# Patient Record
Sex: Female | Born: 1965 | Race: White | Hispanic: No | Marital: Single | Smoking: Never smoker
Health system: Southern US, Community
[De-identification: ages and names within clinical notes are randomized; demographics above are authoritative.]

## PROBLEM LIST (undated history)

## (undated) HISTORY — PX: APPENDECTOMY: SHX54

## (undated) HISTORY — PX: BREAST ENHANCEMENT SURGERY: SHX7

---

## 1992-01-12 HISTORY — PX: AUGMENTATION MAMMAPLASTY: SUR837

## 2015-01-12 LAB — HM MAMMOGRAPHY

## 2015-01-30 ENCOUNTER — Other Ambulatory Visit: Payer: Self-pay

## 2015-02-03 ENCOUNTER — Encounter: Payer: Self-pay | Admitting: Family Medicine

## 2015-02-10 ENCOUNTER — Ambulatory Visit (INDEPENDENT_AMBULATORY_CARE_PROVIDER_SITE_OTHER): Payer: 59 | Admitting: Family Medicine

## 2015-02-10 ENCOUNTER — Encounter: Payer: Self-pay | Admitting: Family Medicine

## 2015-02-10 VITALS — BP 120/64 | HR 64 | Ht 69.0 in | Wt 176.0 lb

## 2015-02-10 DIAGNOSIS — Z79890 Hormone replacement therapy (postmenopausal): Secondary | ICD-10-CM

## 2015-02-10 DIAGNOSIS — Z1211 Encounter for screening for malignant neoplasm of colon: Secondary | ICD-10-CM | POA: Diagnosis not present

## 2015-02-10 LAB — HEMOCCULT GUIAC POC 1CARD (OFFICE): Fecal Occult Blood, POC: NEGATIVE

## 2015-02-10 MED ORDER — ESTROGENS CONJUGATED 1.25 MG PO TABS: 1.2500 mg | ORAL_TABLET | Freq: Every day | ORAL | Status: DC

## 2015-02-10 NOTE — Patient Instructions (Signed)
Hormone Therapy At menopause, your body begins making less estrogen and progesterone hormones. This causes the body to stop having menstrual periods. This is because estrogen and progesterone hormones control your periods and menstrual cycle. A lack of estrogen may cause symptoms such as:  Hot flushes (or hot flashes).  Vaginal dryness.  Dry skin.  Loss of sex drive.  Risk of bone loss (osteoporosis). When this happens, you may choose to take hormone therapy to get back the estrogen lost during menopause. When the hormone estrogen is given alone, it is usually referred to as ET (Estrogen Therapy). When the hormone progestin is combined with estrogen, it is generally called HT (Hormone Therapy). This was formerly known as hormone replacement therapy (HRT). Your caregiver can help you make a decision on what will be best for you. The decision to use HT seems to change often as new studies are done. Many studies do not agree on the benefits of hormone replacement therapy. LIKELY BENEFITS OF HT INCLUDE PROTECTION FROM:  Hot Flushes (also called hot flashes) - A hot flush is a sudden feeling of heat that spreads over the face and body. The skin may redden like a blush. It is connected with sweats and sleep disturbance. Women going through menopause may have hot flushes a few times a month or several times per day depending on the woman.  Osteoporosis (bone loss) - Estrogen helps guard against bone loss. After menopause, a woman's bones slowly lose calcium and become weak and brittle. As a result, bones are more likely to break. The hip, wrist, and spine are affected most often. Hormone therapy can help slow bone loss after menopause. Weight bearing exercise and taking calcium with vitamin D also can help prevent bone loss. There are also medications that your caregiver can prescribe that can help prevent osteoporosis.  Vaginal dryness - Loss of estrogen causes changes in the vagina. Its lining may  become thin and dry. These changes can cause pain and bleeding during sexual intercourse. Dryness can also lead to infections. This can cause burning and itching. (Vaginal estrogen treatment can help relieve pain, itching, and dryness.)  Urinary tract infections are more common after menopause because of lack of estrogen. Some women also develop urinary incontinence because of low estrogen levels in the vagina and bladder.  Possible other benefits of estrogen include a positive effect on mood and short-term memory in women. RISKS AND COMPLICATIONS  Using estrogen alone without progesterone causes the lining of the uterus to grow. This increases the risk of lining of the uterus (endometrial) cancer. Your caregiver should give another hormone called progestin if you have a uterus.  Women who take combined (estrogen and progestin) HT appear to have an increased risk of breast cancer. The risk appears to be small, but increases throughout the time that HT is taken.  Combined therapy also makes the breast tissue slightly denser which makes it harder to read mammograms (breast X-rays).  Combined, estrogen and progesterone therapy can be taken together every day, in which case there may be spotting of blood. HT therapy can be taken cyclically in which case you will have menstrual periods. Cyclically means HT is taken for a set amount of days, then not taken, then this process is repeated.  HT may increase the risk of stroke, heart attack, breast cancer and forming blood clots in your leg.  Transdermal estrogen (estrogen that is absorbed through the skin with a patch or a cream) may have better results with:  Cholesterol.  Blood pressure.  Blood clots. Having the following conditions may indicate you should not have HT:  Endometrial cancer.  Liver disease.  Breast cancer.  Heart disease.  History of blood clots.  Stroke. TREATMENT   If you choose to take HT and have a uterus, usually  estrogen and progestin are prescribed.  Your caregiver will help you decide the best way to take the medications.  Possible ways to take estrogen include:  Pills.  Patches.  Gels.  Sprays.  Vaginal estrogen cream, rings and tablets.  It is best to take the lowest dose possible that will help your symptoms and take them for the shortest period of time that you can.  Hormone therapy can help relieve some of the problems (symptoms) that affect women at menopause. Before making a decision about HT, talk to your caregiver about what is best for you. Be well informed and comfortable with your decisions. HOME CARE INSTRUCTIONS   Follow your caregivers advice when taking the medications.  A Pap test is done to screen for cervical cancer.  The first Pap test should be done at age 34.  Between ages 80 and 52, Pap tests are repeated every 2 years.  Beginning at age 13, you are advised to have a Pap test every 3 years as long as the past 3 Pap tests have been normal.  Some women have medical problems that increase the chance of getting cervical cancer. Talk to your caregiver about these problems. It is especially important to talk to your caregiver if a new problem develops soon after your last Pap test. In these cases, your caregiver may recommend more frequent screening and Pap tests.  The above recommendations are the same for women who have or have not gotten the vaccine for HPV (human papillomavirus).  If you had a hysterectomy for a problem that was not a cancer or a condition that could lead to cancer, then you no longer need Pap tests. However, even if you no longer need a Pap test, a regular exam is a good idea to make sure no other problems are starting.  If you are between ages 20 and 60, and you have had normal Pap tests going back 10 years, you no longer need Pap tests. However, even if you no longer need a Pap test, a regular exam is a good idea to make sure no other problems  are starting.  If you have had past treatment for cervical cancer or a condition that could lead to cancer, you need Pap tests and screening for cancer for at least 20 years after your treatment.  If Pap tests have been discontinued, risk factors (such as a new sexual partner)need to be re-assessed to determine if screening should be resumed.  Some women may need screenings more often if they are at high risk for cervical cancer.  Get mammograms done as per the advice of your caregiver. SEEK IMMEDIATE MEDICAL CARE IF:  You develop abnormal vaginal bleeding.  You have pain or swelling in your legs, shortness of breath, or chest pain.  You develop dizziness or headaches.  You have lumps or changes in your breasts or armpits.  You have slurred speech.  You develop weakness or numbness of your arms or legs.  You have pain, burning, or bleeding when urinating.  You develop abdominal pain.   This information is not intended to replace advice given to you by your health care provider. Make sure you discuss any questions  you have with your health care provider.   Document Released: 09/26/2002 Document Revised: 05/14/2014 Document Reviewed: 07/01/2014 Elsevier Interactive Patient Education 2016 [employer]  

## 2015-02-10 NOTE — Progress Notes (Signed)
Name: Christy Wright   MRN: [id number]    DOB: 10-Mar-1965   Date:02/10/2015       Progress Note  Subjective  Chief Complaint  Chief Complaint  Patient presents with  . Annual Exam  . hormone replacement therapy    HPI Comments: Patient presents for hormone replacement therapy.   No problem-specific assessment & plan notes found for this encounter.   No past medical history on file.  Past Surgical History  Procedure Laterality Date  . Breast enhancement surgery      with GRS  . Appendectomy      No family history on file.  Social History   Social History  . Marital Status: Married    Spouse Name: N/A  . Number of Children: N/A  . Years of Education: N/A   Occupational History  . Not on file.   Social History Main Topics  . Smoking status: Never Smoker   . Smokeless tobacco: Not on file  . Alcohol Use: 0.0 oz/week    0 Standard drinks or equivalent per week  . Drug Use: No  . Sexual Activity: Not on file   Other Topics Concern  . Not on file   Social History Narrative  . No narrative on file    No Known Allergies   Review of Systems  Constitutional: Negative for fever, chills, weight loss and malaise/fatigue.  HENT: Negative for ear discharge, ear pain and sore throat.   Eyes: Negative for blurred vision.  Respiratory: Negative for cough, sputum production, shortness of breath and wheezing.   Cardiovascular: Negative for chest pain, palpitations and leg swelling.  Gastrointestinal: Negative for heartburn, nausea, abdominal pain, diarrhea, constipation, blood in stool and melena.  Genitourinary: Negative for dysuria, urgency, frequency and hematuria.  Musculoskeletal: Negative for myalgias, back pain, joint pain and neck pain.  Skin: Negative for rash.  Neurological: Negative for dizziness, tingling, sensory change, focal weakness and headaches.  Endo/Heme/Allergies: Negative for environmental allergies and polydipsia. Does not bruise/bleed easily.   Psychiatric/Behavioral: Negative for depression and suicidal ideas. The patient is not nervous/anxious and does not have insomnia.      Objective  Filed Vitals:   02/10/15 0946  BP: 120/64  Pulse: 64  Height: 5\' 9"  (1.753 m)  Weight: 176 lb (79.833 kg)    Physical Exam  Constitutional: She is well-developed, well-nourished, and in no distress. No distress.  HENT:  Head: Normocephalic and atraumatic.  Right Ear: External ear normal.  Left Ear: External ear normal.  Nose: Nose normal.  Mouth/Throat: Oropharynx is clear and moist.  Eyes: Conjunctivae and EOM are normal. Pupils are equal, round, and reactive to light. Right eye exhibits no discharge. Left eye exhibits no discharge.  Neck: Normal range of motion. Neck supple. No JVD present. No thyromegaly present.  Cardiovascular: Normal rate, regular rhythm, normal heart sounds and intact distal pulses.  Exam reveals no gallop and no friction rub.   No murmur heard. Pulmonary/Chest: Effort normal and breath sounds normal.  Abdominal: Soft. Bowel sounds are normal. She exhibits no mass. There is no tenderness. There is no guarding.  Genitourinary: Rectum normal.  Prostate normal  Musculoskeletal: Normal range of motion. She exhibits no edema.  Lymphadenopathy:    She has no cervical adenopathy.  Neurological: She is alert. She has normal reflexes.  Skin: Skin is warm and dry. She is not diaphoretic.  Psychiatric: Mood and affect normal.  Nursing note and vitals reviewed.     Assessment & Plan  Problem  List Items Addressed This Visit    None    Visit Diagnoses    Hormone replacement therapy (HRT)    -  Primary    Relevant Medications    estrogens, conjugated, (PREMARIN) 1.25 MG tablet    Colon cancer screening        Relevant Orders    POCT occult blood stool (Completed)    Ambulatory referral to Gastroenterology         Dr. Otilio Miu [facility] [place] [facility]  02/10/2015

## 2015-02-23 ENCOUNTER — Telehealth: Payer: Self-pay | Admitting: Gastroenterology

## 2015-02-23 NOTE — Telephone Encounter (Signed)
Gastroenterology Pre-Procedure Review   Request Date: 03-10-2015 Requesting Physician: Dr.   PATIENT REVIEW QUESTIONS: The patient responded to the following health history questions as indicated:    1. Are you having any GI issues? no 2. Do you have a personal history of Polyps? no 3. Do you have a family history of Colon Cancer or Polyps? no 4. Diabetes Mellitus? no 5. Joint replacements in the past 12 months?no 6. Major health problems in the past 3 months?no 7. Any artificial heart valves, MVP, or defibrillator?no    MEDICATIONS & ALLERGIES:    Patient reports the following regarding taking any anticoagulation/antiplatelet therapy:   Plavix, Coumadin, Eliquis, Xarelto, Lovenox, Pradaxa, Brilinta, or Effient? no Aspirin? Yes  Patient confirms/reports the following medications:  Current Outpatient Prescriptions  Medication Sig Dispense Refill   aspirin 325 MG tablet Take 325 mg by mouth daily.     estradiol (VIVELLE-DOT) 0.1 MG/24HR patch Place 2 patches onto the skin.  6   estrogens, conjugated, (PREMARIN) 1.25 MG tablet Take 1 tablet (1.25 mg total) by mouth daily. 30 tablet 11   No current facility-administered medications for this visit.    Patient confirms/reports the following allergies:  No Known Allergies  No orders of the defined types were placed in this encounter.    AUTHORIZATION INFORMATION Primary Insurance: 1D#: Group #:  Secondary Insurance: 1D#: Group #:  SCHEDULE INFORMATION: Date: 03-10-2015 Time: Location:[facility]

## 2015-02-24 ENCOUNTER — Other Ambulatory Visit: Payer: Self-pay

## 2015-03-03 ENCOUNTER — Encounter: Payer: Self-pay | Admitting: *Deleted

## 2015-03-06 ENCOUNTER — Other Ambulatory Visit: Payer: Self-pay

## 2015-03-06 ENCOUNTER — Telehealth: Payer: Self-pay | Admitting: Gastroenterology

## 2015-03-06 DIAGNOSIS — Z1211 Encounter for screening for malignant neoplasm of colon: Secondary | ICD-10-CM

## 2015-03-06 MED ORDER — NA SULFATE-K SULFATE-MG SULF 17.5-3.13-1.6 GM/177ML PO SOLN: 1.0000 | ORAL | Status: DC

## 2015-03-06 NOTE — Telephone Encounter (Signed)
Pt notified rx has been sent to [facility] per her request.

## 2015-03-06 NOTE — Telephone Encounter (Signed)
Patient has called and stated that she has lost her prescription for her colonoscopy that is scheduled for 03/10/15 with Dr Allen Norris. Patient would like to know if this could be called in at [facility] [place]--[address] [place] [address] Telephone [phone] [facility]. Please call patient once this is done.

## 2015-03-07 NOTE — Discharge Instructions (Signed)

## 2015-03-10 ENCOUNTER — Ambulatory Visit: Payer: 59 | Admitting: Anesthesiology

## 2015-03-10 ENCOUNTER — Ambulatory Visit
Admission: RE | Admit: 2015-03-10 | Discharge: 2015-03-10 | Disposition: A | Discharge status: Home / Self Care | Payer: 59 | Source: Ambulatory Visit | Attending: Gastroenterology | Admitting: Gastroenterology

## 2015-03-10 ENCOUNTER — Encounter
Admission: RE | Disposition: A | Discharge status: Home / Self Care | Payer: Self-pay | Source: Ambulatory Visit | Attending: Gastroenterology

## 2015-03-10 DIAGNOSIS — Z7982 Long term (current) use of aspirin: Secondary | ICD-10-CM | POA: Diagnosis not present

## 2015-03-10 DIAGNOSIS — K640 First degree hemorrhoids: Secondary | ICD-10-CM | POA: Diagnosis not present

## 2015-03-10 DIAGNOSIS — D123 Benign neoplasm of transverse colon: Secondary | ICD-10-CM | POA: Diagnosis not present

## 2015-03-10 DIAGNOSIS — Z1211 Encounter for screening for malignant neoplasm of colon: Secondary | ICD-10-CM | POA: Insufficient documentation

## 2015-03-10 DIAGNOSIS — Z79899 Other long term (current) drug therapy: Secondary | ICD-10-CM | POA: Insufficient documentation

## 2015-03-10 HISTORY — PX: COLONOSCOPY WITH PROPOFOL: SHX5780

## 2015-03-10 HISTORY — PX: POLYPECTOMY: SHX5525

## 2015-03-10 SURGERY — COLONOSCOPY WITH PROPOFOL
Anesthesia: Monitor Anesthesia Care

## 2015-03-10 MED ORDER — ACETAMINOPHEN 325 MG PO TABS: 325.0000 mg | ORAL_TABLET | ORAL | Status: DC | PRN

## 2015-03-10 MED ORDER — LIDOCAINE HCL (CARDIAC) 20 MG/ML IV SOLN
INTRAVENOUS | Status: DC | PRN
  Administered 2015-03-10: 20 mg via INTRAVENOUS

## 2015-03-10 MED ORDER — STERILE WATER FOR IRRIGATION IR SOLN
Status: DC | PRN
  Administered 2015-03-10: 10:00:00

## 2015-03-10 MED ORDER — PROPOFOL 10 MG/ML IV BOLUS
INTRAVENOUS | Status: DC | PRN
  Administered 2015-03-10: 10 mg via INTRAVENOUS
  Administered 2015-03-10 (×2): 20 mg via INTRAVENOUS
  Administered 2015-03-10: 30 mg via INTRAVENOUS
  Administered 2015-03-10: 20 mg via INTRAVENOUS
  Administered 2015-03-10: 100 mg via INTRAVENOUS
  Administered 2015-03-10 (×2): 50 mg via INTRAVENOUS

## 2015-03-10 MED ORDER — ACETAMINOPHEN 160 MG/5ML PO SOLN: 325.0000 mg | ORAL | Status: DC | PRN

## 2015-03-10 MED ORDER — LACTATED RINGERS IV SOLN
INTRAVENOUS | Status: DC
  Administered 2015-03-10 (×2): via INTRAVENOUS

## 2015-03-10 MED ORDER — SODIUM CHLORIDE 0.9 % IV SOLN: INTRAVENOUS | Status: DC

## 2015-03-10 SURGICAL SUPPLY — 28 items
CANISTER SUCT 1200ML W/VALVE (MISCELLANEOUS) ×3 IMPLANT
FCP ESCP3.2XJMB 240X2.8X (MISCELLANEOUS) ×2
FORCEPS BIOP RAD 4 LRG CAP 4 (CUTTING FORCEPS) IMPLANT
FORCEPS BIOP RJ4 240 W/NDL (MISCELLANEOUS) ×1
FORCEPS ESCP3.2XJMB 240X2.8X (MISCELLANEOUS) ×2 IMPLANT
GOWN CVR UNV OPN BCK APRN NK (MISCELLANEOUS) ×4 IMPLANT
GOWN ISOL THUMB LOOP REG UNIV (MISCELLANEOUS) ×2
HEMOCLIP INSTINCT (CLIP) IMPLANT
INJECTOR VARIJECT VIN23 (MISCELLANEOUS) IMPLANT
KIT CO2 TUBING (TUBING) IMPLANT
KIT DEFENDO VALVE AND CONN (KITS) IMPLANT
KIT ENDO PROCEDURE OLY (KITS) ×3 IMPLANT
LIGATOR MULTIBAND 6SHOOTER MBL (MISCELLANEOUS) IMPLANT
MARKER SPOT ENDO TATTOO 5ML (MISCELLANEOUS) IMPLANT
PAD GROUND ADULT SPLIT (MISCELLANEOUS) IMPLANT
SNARE SHORT THROW 13M SML OVAL (MISCELLANEOUS) IMPLANT
SNARE SHORT THROW 30M LRG OVAL (MISCELLANEOUS) IMPLANT
SPOT EX ENDOSCOPIC TATTOO (MISCELLANEOUS)
SUCTION POLY TRAP 4CHAMBER (MISCELLANEOUS) IMPLANT
TRAP SUCTION POLY (MISCELLANEOUS) IMPLANT
TUBING CONN 6MMX3.1M (TUBING)
TUBING SUCTION CONN 0.25 STRL (TUBING) IMPLANT
UNDERPAD 30X60 958B10 (PK) (MISCELLANEOUS) IMPLANT
VALVE BIOPSY ENDO (VALVE) IMPLANT
VARIJECT INJECTOR VIN23 (MISCELLANEOUS)
WATER AUXILLARY (MISCELLANEOUS) IMPLANT
WATER STERILE IRR 250ML POUR (IV SOLUTION) ×3 IMPLANT
WATER STERILE IRR 500ML POUR (IV SOLUTION) IMPLANT

## 2015-03-10 NOTE — Anesthesia Preprocedure Evaluation (Signed)
Anesthesia Evaluation  Patient identified by MRN, date of birth, ID band  Reviewed: Allergy & Precautions, H&P , NPO status , Patient's Chart, lab work & pertinent test results  Airway Mallampati: II  TM Distance: >3 FB Neck ROM: full    Dental no notable dental hx.    Pulmonary    Pulmonary exam normal       Cardiovascular Rhythm:regular Rate:Normal     Neuro/Psych    GI/Hepatic   Endo/Other    Renal/GU      Musculoskeletal   Abdominal   Peds  Hematology   Anesthesia Other Findings   Reproductive/Obstetrics                             Anesthesia Physical Anesthesia Plan  ASA: I  Anesthesia Plan: MAC   Post-op Pain Management:    Induction:   Airway Management Planned:   Additional Equipment:   Intra-op Plan:   Post-operative Plan:   Informed Consent: I have reviewed the patients History and Physical, chart, labs and discussed the procedure including the risks, benefits and alternatives for the proposed anesthesia with the patient or authorized representative who has indicated his/her understanding and acceptance.     Plan Discussed with: CRNA  Anesthesia Plan Comments:         Anesthesia Quick Evaluation  

## 2015-03-10 NOTE — Transfer of Care (Signed)
Immediate Anesthesia Transfer of Care Note  Patient: Christy Wright  Procedure(s) Performed: Procedure(s): COLONOSCOPY WITH PROPOFOL (N/A) POLYPECTOMY  Patient Location: PACU  Anesthesia Type: MAC  Level of Consciousness: awake, alert  and patient cooperative  Airway and Oxygen Therapy: Patient Spontanous Breathing and Patient connected to supplemental oxygen  Post-op Assessment: Post-op Vital signs reviewed, Patient's Cardiovascular Status Stable, Respiratory Function Stable, Patent Airway and No signs of Nausea or vomiting  Post-op Vital Signs: Reviewed and stable  Complications: No apparent anesthesia complications

## 2015-03-10 NOTE — H&P (Signed)
  [facility]  [address] [place], [place] [address] Phone: [phone] Fax : [phone]  Primary Care Physician:  Otilio Miu, MD Primary Gastroenterologist:  Dr. Allen Norris  Pre-Procedure History & Physical: HPI:  Christy Wright is a 50 y.o. female is here for a screening colonoscopy.   History reviewed. No pertinent past medical history.  Past Surgical History  Procedure Laterality Date  . Breast enhancement surgery      with GRS  . Appendectomy      Prior to Admission medications   Medication Sig Start Date End Date Taking? Authorizing Provider  aspirin 325 MG tablet Take 325 mg by mouth daily.    Historical Provider, MD  estrogens, conjugated, (PREMARIN) 1.25 MG tablet Take 1 tablet (1.25 mg total) by mouth daily. 02/10/15   Juline Patch, MD  Na Sulfate-K Sulfate-Mg Sulf (SUPREP BOWEL PREP) SOLN Take 1 kit by mouth as directed. 03/06/15   Lucilla Lame, MD    Allergies as of 02/24/2015  . (No Known Allergies)    History reviewed. No pertinent family history.  Social History   Social History  . Marital Status: Single    Spouse Name: N/A  . Number of Children: N/A  . Years of Education: N/A   Occupational History  . Not on file.   Social History Main Topics  . Smoking status: Never Smoker   . Smokeless tobacco: Not on file  . Alcohol Use: 0.6 oz/week    0 Standard drinks or equivalent, 1 Glasses of wine per week  . Drug Use: No  . Sexual Activity: Not on file   Other Topics Concern  . Not on file   Social History Narrative    Review of Systems: See HPI, otherwise negative ROS  Physical Exam: BP 101/70 mmHg  Pulse 56  Temp(Src) 98.1 F (36.7 C) (Tympanic)  Resp 16  Ht 5' 10" (1.778 m)  Wt 166 lb (75.297 kg)  BMI 23.82 kg/m2  SpO2 100% General:   Alert,  pleasant and cooperative in NAD Head:  Normocephalic and atraumatic. Neck:  Supple; no masses or thyromegaly. Lungs:  Clear throughout to auscultation.    Heart:  Regular  rate and rhythm. Abdomen:  Soft, nontender and nondistended. Normal bowel sounds, without guarding, and without rebound.   Neurologic:  Alert and  oriented x4;  grossly normal neurologically.  Impression/Plan: Christy Wright is now here to undergo a screening colonoscopy.  Risks, benefits, and alternatives regarding colonoscopy have been reviewed with the patient.  Questions have been answered.  All parties agreeable.

## 2015-03-10 NOTE — Op Note (Signed)
[facility] Gastroenterology Patient Name: Christy Wright Procedure Date: 03/10/2015 9:39 AM MRN: [id number] Account #: [id number] Date of Birth: May 03, 1965 Admit Type: Outpatient Age: 50 Room: [facility] OR ROOM 01 Gender: Female Note Status: Finalized Procedure:            Colonoscopy Indications:          Screening for colorectal malignant neoplasm Providers:            Lucilla Lame, MD Referring MD:         Juline Patch, MD (Referring MD) Medicines:            Propofol per Anesthesia Complications:        No immediate complications. Procedure:            Pre-Anesthesia Assessment:                       - Prior to the procedure, a History and Physical was                        performed, and patient medications and allergies were                        reviewed. The patient's tolerance of previous                        anesthesia was also reviewed. The risks and benefits of                        the procedure and the sedation options and risks were                        discussed with the patient. All questions were                        answered, and informed consent was obtained. Prior                        Anticoagulants: The patient has taken no previous                        anticoagulant or antiplatelet agents. ASA Grade                        Assessment: II - A patient with mild systemic disease.                        After reviewing the risks and benefits, the patient was                        deemed in satisfactory condition to undergo the                        procedure.                       After obtaining informed consent, the colonoscope was                        passed under direct vision. Throughout the procedure,  the patient's blood pressure, pulse, and oxygen                        saturations were monitored continuously. The Olympus                        CF-HQ190L Colonoscope (S#. [id number]) was introduced                 through the anus and advanced to the the cecum,                        identified by appendiceal orifice and ileocecal valve.                        The colonoscopy was performed without difficulty. The                        patient tolerated the procedure well. The quality of                        the bowel preparation was excellent. Findings:      The perianal and digital rectal examinations were normal.      A 2 mm polyp was found in the transverse colon. The polyp was sessile.       The polyp was removed with a cold biopsy forceps. Resection and       retrieval were complete.      Non-bleeding internal hemorrhoids were found during retroflexion. The       hemorrhoids were Grade I (internal hemorrhoids that do not prolapse). Impression:           - One 2 mm polyp in the transverse colon, removed with                        a cold biopsy forceps. Resected and retrieved.                       - Non-bleeding internal hemorrhoids. Recommendation:       - Await pathology results.                       - Repeat colonoscopy in 5 years if polyp adenoma and 10                        years if hyperplastic Procedure Code(s):    --- Professional ---                       [phone], Colonoscopy, flexible; with biopsy, single or                        multiple Diagnosis Code(s):    --- Professional ---                       Z12.11, Encounter for screening for malignant neoplasm                        of colon                       D12.3, Benign neoplasm of transverse colon (hepatic  flexure or splenic flexure) CPT copyright 2016 American Medical Association. All rights reserved. The codes documented in this report are preliminary and upon coder review may  be revised to meet current compliance requirements. Lucilla Lame, MD 03/10/2015 10:04:00 AM This report has been signed electronically. Number of Addenda: 0 Note Initiated On: 03/10/2015 9:39 AM Scope  Withdrawal Time: 0 hours 6 minutes 36 seconds  Total Procedure Duration: 0 hours 11 minutes 21 seconds       [facility]

## 2015-03-10 NOTE — Anesthesia Postprocedure Evaluation (Signed)
Anesthesia Post Note  Patient: Christy Wright  Procedure(s) Performed: Procedure(s) (LRB): COLONOSCOPY WITH PROPOFOL (N/A) POLYPECTOMY  Patient location during evaluation: PACU Anesthesia Type: MAC Level of consciousness: awake and alert and oriented Pain management: satisfactory to patient Vital Signs Assessment: post-procedure vital signs reviewed and stable Respiratory status: spontaneous breathing, nonlabored ventilation and respiratory function stable Cardiovascular status: blood pressure returned to baseline and stable Postop Assessment: Adequate PO intake and No signs of nausea or vomiting Anesthetic complications: no    Raliegh Ip

## 2015-03-10 NOTE — Anesthesia Procedure Notes (Signed)
Procedure Name: MAC Performed by: Carrick Rijos Pre-anesthesia Checklist: Patient identified, Emergency Drugs available, Suction available, Patient being monitored and Timeout performed Patient Re-evaluated:Patient Re-evaluated prior to inductionOxygen Delivery Method: Nasal cannula       

## 2015-03-11 ENCOUNTER — Encounter: Payer: Self-pay | Admitting: Gastroenterology

## 2015-03-12 ENCOUNTER — Encounter: Payer: Self-pay | Admitting: Gastroenterology

## 2015-08-11 ENCOUNTER — Ambulatory Visit: Payer: 59 | Admitting: Family Medicine

## 2016-02-16 ENCOUNTER — Other Ambulatory Visit: Payer: Self-pay

## 2016-02-17 ENCOUNTER — Telehealth: Payer: Self-pay

## 2016-02-17 NOTE — Telephone Encounter (Signed)
Please call pt- she called needing an appt for med refill and paperwork filled out- thank you

## 2016-02-23 ENCOUNTER — Ambulatory Visit (INDEPENDENT_AMBULATORY_CARE_PROVIDER_SITE_OTHER): Payer: 59 | Admitting: Family Medicine

## 2016-02-23 VITALS — BP 120/80 | HR 72 | Ht 70.0 in | Wt 188.0 lb

## 2016-02-23 DIAGNOSIS — T385X5D Adverse effect of other estrogens and progestogens, subsequent encounter: Secondary | ICD-10-CM | POA: Diagnosis not present

## 2016-02-23 DIAGNOSIS — Z021 Encounter for pre-employment examination: Secondary | ICD-10-CM | POA: Diagnosis not present

## 2016-02-23 DIAGNOSIS — Z23 Encounter for immunization: Secondary | ICD-10-CM

## 2016-02-23 DIAGNOSIS — Z0289 Encounter for other administrative examinations: Secondary | ICD-10-CM

## 2016-02-23 DIAGNOSIS — E2839 Other primary ovarian failure: Secondary | ICD-10-CM | POA: Diagnosis not present

## 2016-02-23 DIAGNOSIS — E348 Other specified endocrine disorders: Secondary | ICD-10-CM | POA: Diagnosis not present

## 2016-02-23 MED ORDER — ESTRADIOL 2 MG PO TABS: 2.0000 mg | ORAL_TABLET | Freq: Every day | ORAL | 3 refills | Status: DC

## 2016-02-23 NOTE — Progress Notes (Signed)
Name: Christy Wright   MRN: [id number]    DOB: 01/28/1965   Date:02/23/2016       Progress Note  Subjective  Chief Complaint  Chief Complaint  Patient presents with  . hormone replacement therapy    discuss med change  . needs labs    glucose and lipid    Patient with endocrine disorder ererquiring supplemental hormone replacement.  Postsurgical replacement.  No side effect issues. Patient has some recent emplotment change with adustment measures.Patient has wellness employment needs.   Gynecologic Exam  The patient's pertinent negatives include no genital itching, genital lesions, genital odor, missed menses, pelvic pain, vaginal bleeding or vaginal discharge. Primary symptoms comment: hormone replacement needs. This is a chronic problem. The current episode started more than 1 year ago. The problem occurs daily. The problem has been gradually improving. The pain is mild. Pertinent negatives include no abdominal pain, anorexia, back pain, chills, constipation, diarrhea, discolored urine, dysuria, fever, flank pain, frequency, headaches, hematuria, joint pain, joint swelling, nausea, painful intercourse, rash, sore throat or urgency. Nothing aggravates the symptoms. Treatments tried: oral estogen. The treatment provided significant relief. Her past medical history is significant for a gynecological surgery. There is no history of an abdominal surgery or vaginosis.    No problem-specific Assessment & Plan notes found for this encounter.   No past medical history on file.  Past Surgical History:  Procedure Laterality Date  . APPENDECTOMY    . BREAST ENHANCEMENT SURGERY     with GRS  . COLONOSCOPY WITH PROPOFOL N/A 03/10/2015   Procedure: COLONOSCOPY WITH PROPOFOL;  Surgeon: Lucilla Lame, MD;  Location: [place];  Service: Endoscopy;  Laterality: N/A;  . POLYPECTOMY  03/10/2015   Procedure: POLYPECTOMY;  Surgeon: Lucilla Lame, MD;  Location: [place];  Service:  Endoscopy;;    No family history on file.  Social History   Social History  . Marital status: Single    Spouse name: N/A  . Number of children: N/A  . Years of education: N/A   Occupational History  . Not on file.   Social History Main Topics  . Smoking status: Never Smoker  . Smokeless tobacco: Not on file  . Alcohol use 0.6 oz/week    1 Glasses of wine per week  . Drug use: No  . Sexual activity: Not on file   Other Topics Concern  . Not on file   Social History Narrative  . No narrative on file    No Known Allergies   Review of Systems  Constitutional: Negative for chills, fever, malaise/fatigue and weight loss.  HENT: Negative for ear discharge, ear pain and sore throat.   Eyes: Negative for blurred vision.  Respiratory: Negative for cough, sputum production, shortness of breath and wheezing.   Cardiovascular: Negative for chest pain, palpitations and leg swelling.  Gastrointestinal: Negative for abdominal pain, anorexia, blood in stool, constipation, diarrhea, heartburn, melena and nausea.  Genitourinary: Negative for dysuria, flank pain, frequency, hematuria, missed menses, pelvic pain, urgency and vaginal discharge.  Musculoskeletal: Negative for back pain, joint pain, myalgias and neck pain.  Skin: Negative for rash.  Neurological: Negative for dizziness, tingling, sensory change, focal weakness and headaches.  Endo/Heme/Allergies: Negative for environmental allergies and polydipsia. Does not bruise/bleed easily.  Psychiatric/Behavioral: Negative for depression and suicidal ideas. The patient is not nervous/anxious and does not have insomnia.      Objective  Vitals:   02/23/16 0821  BP: 120/80  Pulse: 72  Weight:  188 lb (85.3 kg)  Height: 5\' 10"  (1.778 m)    Physical Exam  Constitutional: She is well-developed, well-nourished, and in no distress. No distress.  HENT:  Head: Normocephalic and atraumatic.  Right Ear: External ear normal.  Left  Ear: External ear normal.  Nose: Nose normal.  Mouth/Throat: Oropharynx is clear and moist.  Eyes: Conjunctivae and EOM are normal. Pupils are equal, round, and reactive to light. Right eye exhibits no discharge. Left eye exhibits no discharge.  Neck: Normal range of motion. Neck supple. No JVD present. No thyromegaly present.  Cardiovascular: Normal rate, regular rhythm, normal heart sounds and intact distal pulses.  Exam reveals no gallop and no friction rub.   No murmur heard. Pulmonary/Chest: Effort normal and breath sounds normal. No respiratory distress. She has no wheezes. She has no rales. She exhibits no tenderness. Right breast exhibits no inverted nipple, no mass, no nipple discharge, no skin change and no tenderness. Left breast exhibits no inverted nipple, no mass, no nipple discharge, no skin change and no tenderness. Breasts are symmetrical.  implants  Abdominal: Soft. Bowel sounds are normal. She exhibits no mass. There is no tenderness. There is no rebound and no guarding.  Musculoskeletal: Normal range of motion. She exhibits no edema or tenderness.  Lymphadenopathy:    She has no cervical adenopathy.  Neurological: She is alert. She has normal reflexes.  Skin: Skin is warm and dry. No rash noted. She is not diaphoretic. No erythema.  Psychiatric: Mood and affect normal.  Nursing note and vitals reviewed.     Assessment & Plan  Problem List Items Addressed This Visit    None    Visit Diagnoses    Adverse effect of female hormone replacement therapy, subsequent encounter    -  Primary   Relevant Medications   estradiol (ESTRACE) 2 MG tablet   Female hypogonadism       Relevant Medications   estradiol (ESTRACE) 2 MG tablet   Encounter for physical examination related to employment       Relevant Orders   Lipid panel   Glucose   Need for Tdap vaccination       Relevant Orders   Tdap vaccine greater than or equal to 7yo IM (Completed)        Dr. [facility]  02/23/16

## 2016-02-24 LAB — LIPID PANEL
CHOL/HDL RATIO: 2.8 ratio (ref 0.0–4.4)
CHOLESTEROL TOTAL: 219 mg/dL — AB (ref 100–199)
HDL: 78 mg/dL (ref >39)
LDL Calculated: 124 mg/dL — ABNORMAL HIGH (ref 0–99)
TRIGLYCERIDES: 83 mg/dL (ref 0–149)
VLDL CHOLESTEROL CAL: 17 mg/dL (ref 5–40)

## 2016-02-24 LAB — GLUCOSE, RANDOM: Glucose: 90 mg/dL (ref 65–99)

## 2016-03-02 ENCOUNTER — Other Ambulatory Visit: Payer: Self-pay

## 2016-03-02 DIAGNOSIS — T385X5D Adverse effect of other estrogens and progestogens, subsequent encounter: Secondary | ICD-10-CM

## 2016-03-02 DIAGNOSIS — E2839 Other primary ovarian failure: Secondary | ICD-10-CM

## 2016-03-02 MED ORDER — ESTRADIOL 2 MG PO TABS: 2.0000 mg | ORAL_TABLET | Freq: Two times a day (BID) | ORAL | 3 refills | Status: DC

## 2016-03-18 ENCOUNTER — Other Ambulatory Visit: Payer: Self-pay

## 2016-04-15 ENCOUNTER — Other Ambulatory Visit: Payer: Self-pay

## 2016-04-19 ENCOUNTER — Telehealth: Payer: Self-pay

## 2016-04-19 NOTE — Telephone Encounter (Signed)
Taking 2 tabs daily now of the 2 mg Estradiol and because her old Rx is for 1 daily they said she finished to early and she can not refill. Needs new Rx to read Estadiol 2 mg twice daily. [facility].

## 2016-04-20 ENCOUNTER — Other Ambulatory Visit: Payer: Self-pay

## 2016-04-20 DIAGNOSIS — E2839 Other primary ovarian failure: Secondary | ICD-10-CM

## 2016-04-20 DIAGNOSIS — T385X5D Adverse effect of other estrogens and progestogens, subsequent encounter: Secondary | ICD-10-CM

## 2016-04-20 MED ORDER — ESTRADIOL 2 MG PO TABS
2.0000 mg | ORAL_TABLET | Freq: Two times a day (BID) | ORAL | 0 refills | Status: DC
Start: 2016-04-20 — End: 2016-07-18

## 2016-04-20 NOTE — Telephone Encounter (Signed)
Sent this morning [facility]

## 2016-07-18 ENCOUNTER — Other Ambulatory Visit: Payer: Self-pay | Admitting: Family Medicine

## 2016-07-18 DIAGNOSIS — T385X5D Adverse effect of other estrogens and progestogens, subsequent encounter: Secondary | ICD-10-CM

## 2016-07-18 DIAGNOSIS — E2839 Other primary ovarian failure: Secondary | ICD-10-CM

## 2016-07-23 ENCOUNTER — Other Ambulatory Visit: Payer: Self-pay | Admitting: Family Medicine

## 2016-07-23 DIAGNOSIS — E2839 Other primary ovarian failure: Secondary | ICD-10-CM

## 2016-07-23 DIAGNOSIS — T385X5D Adverse effect of other estrogens and progestogens, subsequent encounter: Secondary | ICD-10-CM

## 2016-10-13 ENCOUNTER — Other Ambulatory Visit: Payer: Self-pay | Admitting: Family Medicine

## 2016-10-13 ENCOUNTER — Other Ambulatory Visit: Payer: Self-pay

## 2016-10-13 DIAGNOSIS — T385X5D Adverse effect of other estrogens and progestogens, subsequent encounter: Secondary | ICD-10-CM

## 2016-10-13 DIAGNOSIS — E2839 Other primary ovarian failure: Secondary | ICD-10-CM

## 2017-01-21 ENCOUNTER — Encounter: Payer: Self-pay | Admitting: Family Medicine

## 2017-01-21 ENCOUNTER — Ambulatory Visit (INDEPENDENT_AMBULATORY_CARE_PROVIDER_SITE_OTHER): Payer: BLUE CROSS/BLUE SHIELD | Admitting: Family Medicine

## 2017-01-21 VITALS — BP 120/70 | HR 64 | Ht 70.0 in | Wt 190.0 lb

## 2017-01-21 DIAGNOSIS — E785 Hyperlipidemia, unspecified: Secondary | ICD-10-CM | POA: Diagnosis not present

## 2017-01-21 DIAGNOSIS — Z1231 Encounter for screening mammogram for malignant neoplasm of breast: Secondary | ICD-10-CM

## 2017-01-21 DIAGNOSIS — T385X5D Adverse effect of other estrogens and progestogens, subsequent encounter: Secondary | ICD-10-CM

## 2017-01-21 DIAGNOSIS — Z1239 Encounter for other screening for malignant neoplasm of breast: Secondary | ICD-10-CM

## 2017-01-21 DIAGNOSIS — E2839 Other primary ovarian failure: Secondary | ICD-10-CM | POA: Diagnosis not present

## 2017-01-21 DIAGNOSIS — Z23 Encounter for immunization: Secondary | ICD-10-CM | POA: Diagnosis not present

## 2017-01-21 DIAGNOSIS — R69 Illness, unspecified: Secondary | ICD-10-CM | POA: Diagnosis not present

## 2017-01-21 MED ORDER — ESTRADIOL 2 MG PO TABS: 2.0000 mg | ORAL_TABLET | Freq: Two times a day (BID) | ORAL | 3 refills | Status: DC

## 2017-01-21 NOTE — Progress Notes (Signed)
Name: Christy Wright   MRN: [id number]    DOB: September 23, 1965   Date:01/21/2017       Progress Note  Subjective  Chief Complaint  Chief Complaint  Patient presents with  . Annual Exam  . hormone replacement therapy    Patient presents for annual evaluation on hormone replacement therapy.    No problem-specific Assessment & Plan notes found for this encounter.   History reviewed. No pertinent past medical history.  Past Surgical History:  Procedure Laterality Date  . APPENDECTOMY    . BREAST ENHANCEMENT SURGERY     with GRS  . COLONOSCOPY WITH PROPOFOL N/A 03/10/2015   Procedure: COLONOSCOPY WITH PROPOFOL;  Surgeon: Lucilla Lame, MD;  Location: [place];  Service: Endoscopy;  Laterality: N/A;  . POLYPECTOMY  03/10/2015   Procedure: POLYPECTOMY;  Surgeon: Lucilla Lame, MD;  Location: [place];  Service: Endoscopy;;    History reviewed. No pertinent family history.  Social History   Socioeconomic History  . Marital status: Single    Spouse name: Not on file  . Number of children: Not on file  . Years of education: Not on file  . Highest education level: Not on file  Social Needs  . Financial resource strain: Not on file  . Food insecurity - worry: Not on file  . Food insecurity - inability: Not on file  . Transportation needs - medical: Not on file  . Transportation needs - non-medical: Not on file  Occupational History  . Not on file  Tobacco Use  . Smoking status: Never Smoker  . Smokeless tobacco: Never Used  Substance and Sexual Activity  . Alcohol use: Yes    Alcohol/week: 0.6 oz    Types: 1 Glasses of wine per week  . Drug use: No  . Sexual activity: Not on file  Other Topics Concern  . Not on file  Social History Narrative  . Not on file    No Known Allergies  Outpatient Medications Prior to Visit  Medication Sig Dispense Refill  . aspirin 325 MG tablet Take 325 mg by mouth daily.    . Multiple Vitamins-Minerals (CENTRUM ADULTS  PO) Take 1 tablet by mouth daily.    Marland Kitchen estradiol (ESTRACE) 2 MG tablet TAKE 1 TABLET (2 MG TOTAL) BY MOUTH 2 (TWO) TIMES DAILY. 180 tablet 0  . estradiol (ESTRACE) 2 MG tablet TAKE 1 TABLET (2 MG TOTAL) BY MOUTH 2 (TWO) TIMES DAILY. 60 tablet 0   No facility-administered medications prior to visit.     Review of Systems  Constitutional: Negative for chills, fever, malaise/fatigue and weight loss.  HENT: Negative for ear discharge, ear pain and sore throat.   Eyes: Negative for blurred vision.  Respiratory: Negative for cough, sputum production, shortness of breath and wheezing.   Cardiovascular: Negative for chest pain, palpitations and leg swelling.  Gastrointestinal: Negative for abdominal pain, blood in stool, constipation, diarrhea, heartburn, melena and nausea.  Genitourinary: Negative for dysuria, frequency, hematuria and urgency.  Musculoskeletal: Negative for back pain, joint pain, myalgias and neck pain.  Skin: Negative for rash.  Neurological: Negative for dizziness, tingling, sensory change, focal weakness and headaches.  Endo/Heme/Allergies: Negative for environmental allergies and polydipsia. Does not bruise/bleed easily.  Psychiatric/Behavioral: Negative for depression and suicidal ideas. The patient is not nervous/anxious and does not have insomnia.      Objective  Vitals:   01/21/17 0845  BP: 120/70  Pulse: 64  Weight: 190 lb (86.2 kg)  Height: 5'  10" (1.778 m)    Physical Exam  Constitutional: She is well-developed, well-nourished, and in no distress. No distress.  HENT:  Head: Normocephalic and atraumatic.  Right Ear: External ear normal.  Left Ear: External ear normal.  Nose: Nose normal.  Mouth/Throat: Oropharynx is clear and moist.  Eyes: Conjunctivae and EOM are normal. Pupils are equal, round, and reactive to light. Right eye exhibits no discharge. Left eye exhibits no discharge.  Neck: Normal range of motion. Neck supple. No JVD present. No  thyromegaly present.  Cardiovascular: Normal rate, regular rhythm, normal heart sounds and intact distal pulses. Exam reveals no gallop and no friction rub.  No murmur heard. Pulmonary/Chest: Effort normal and breath sounds normal. She has no wheezes. She has no rales.  Abdominal: Soft. Bowel sounds are normal. She exhibits no mass. There is no tenderness. There is no guarding.  Genitourinary: Rectum normal and prostate normal.  Musculoskeletal: Normal range of motion. She exhibits no edema.  Lymphadenopathy:    She has no cervical adenopathy.  Neurological: She is alert. She has normal reflexes.  Skin: Skin is warm and dry. She is not diaphoretic.  Psychiatric: Mood and affect normal.  Nursing note and vitals reviewed.     Assessment & Plan  Problem List Items Addressed This Visit    None    Visit Diagnoses    Female hypogonadism    -  Primary   Relevant Medications   estradiol (ESTRACE) 2 MG tablet   Other Relevant Orders   Renal function panel   Adverse effect of female hormone replacement therapy, subsequent encounter       Relevant Medications   estradiol (ESTRACE) 2 MG tablet   Breast cancer screening       Influenza vaccine needed       Relevant Orders   Flu Vaccine QUAD 36+ mos IM (Completed)   Taking medication for chronic disease       Relevant Orders   Hepatic Function Panel (6)   Dyslipidemia       Relevant Orders   Lipid panel      Meds ordered this encounter  Medications  . estradiol (ESTRACE) 2 MG tablet    Sig: Take 1 tablet (2 mg total) by mouth 2 (two) times daily.    Dispense:  180 tablet    Refill:  3      Dr. Otilio Miu [facility]  01/21/17

## 2017-01-22 LAB — RENAL FUNCTION PANEL
Albumin: 4.7 g/dL (ref 3.5–5.5)
BUN/Creatinine Ratio: 14 (ref 9–23)
BUN: 14 mg/dL (ref 6–24)
CO2: 26 mmol/L (ref 20–29)
Calcium: 10 mg/dL (ref 8.7–10.2)
Chloride: 102 mmol/L (ref 96–106)
Creatinine, Ser: 0.97 mg/dL (ref 0.57–1.00)
GFR, EST AFRICAN AMERICAN: 77 mL/min/{1.73_m2} (ref >59)
GFR, EST NON AFRICAN AMERICAN: 67 mL/min/{1.73_m2} (ref >59)
GLUCOSE: 81 mg/dL (ref 65–99)
POTASSIUM: 4.7 mmol/L (ref 3.5–5.2)
Phosphorus: 3.1 mg/dL (ref 2.5–4.5)
SODIUM: 143 mmol/L (ref 134–144)

## 2017-01-22 LAB — LIPID PANEL
CHOL/HDL RATIO: 3.4 ratio (ref 0.0–4.4)
Cholesterol, Total: 257 mg/dL — ABNORMAL HIGH (ref 100–199)
HDL: 75 mg/dL (ref >39)
LDL Calculated: 164 mg/dL — ABNORMAL HIGH (ref 0–99)
Triglycerides: 89 mg/dL (ref 0–149)
VLDL Cholesterol Cal: 18 mg/dL (ref 5–40)

## 2017-01-22 LAB — HEPATIC FUNCTION PANEL (6)
ALK PHOS: 60 IU/L (ref 39–117)
ALT: 24 IU/L (ref 0–32)
AST: 23 IU/L (ref 0–40)
Bilirubin Total: 0.5 mg/dL (ref 0.0–1.2)
Bilirubin, Direct: 0.14 mg/dL (ref 0.00–0.40)

## 2017-04-13 ENCOUNTER — Other Ambulatory Visit: Payer: Self-pay | Admitting: Family Medicine

## 2017-04-13 DIAGNOSIS — T385X5D Adverse effect of other estrogens and progestogens, subsequent encounter: Secondary | ICD-10-CM

## 2017-04-13 DIAGNOSIS — E2839 Other primary ovarian failure: Secondary | ICD-10-CM

## 2017-06-02 ENCOUNTER — Other Ambulatory Visit: Payer: Self-pay

## 2017-07-08 ENCOUNTER — Other Ambulatory Visit: Payer: Self-pay | Admitting: Family Medicine

## 2017-07-08 DIAGNOSIS — E2839 Other primary ovarian failure: Secondary | ICD-10-CM

## 2017-07-08 DIAGNOSIS — T385X5D Adverse effect of other estrogens and progestogens, subsequent encounter: Secondary | ICD-10-CM

## 2017-10-15 ENCOUNTER — Other Ambulatory Visit: Payer: Self-pay | Admitting: Family Medicine

## 2017-10-15 DIAGNOSIS — E2839 Other primary ovarian failure: Secondary | ICD-10-CM

## 2017-10-15 DIAGNOSIS — T385X5D Adverse effect of other estrogens and progestogens, subsequent encounter: Secondary | ICD-10-CM

## 2017-10-27 ENCOUNTER — Ambulatory Visit (INDEPENDENT_AMBULATORY_CARE_PROVIDER_SITE_OTHER): Payer: BLUE CROSS/BLUE SHIELD | Admitting: Family Medicine

## 2017-10-27 ENCOUNTER — Encounter: Payer: Self-pay | Admitting: Family Medicine

## 2017-10-27 VITALS — BP 130/80 | HR 68 | Ht 70.0 in | Wt 190.0 lb

## 2017-10-27 DIAGNOSIS — E2839 Other primary ovarian failure: Secondary | ICD-10-CM | POA: Diagnosis not present

## 2017-10-27 DIAGNOSIS — Z23 Encounter for immunization: Secondary | ICD-10-CM

## 2017-10-27 MED ORDER — ESTRADIOL 2 MG PO TABS: 2.0000 mg | ORAL_TABLET | Freq: Two times a day (BID) | ORAL | 3 refills | Status: DC

## 2017-10-27 NOTE — Progress Notes (Signed)
Date:  10/27/2017   Name:  Christy Wright   DOB:  [birth year]   MRN:  [id number]   Chief Complaint: hormone replacement therapy Patient presents for refill hormone replacement.therapy.    Review of Systems  Constitutional: Negative.  Negative for chills, fatigue, fever and unexpected weight change.  HENT: Negative for congestion, ear discharge, ear pain, rhinorrhea, sinus pressure, sneezing and sore throat.   Eyes: Negative for photophobia, pain, discharge, redness and itching.  Respiratory: Negative for cough, shortness of breath, wheezing and stridor.   Gastrointestinal: Negative for abdominal pain, blood in stool, constipation, diarrhea, nausea and vomiting.  Endocrine: Negative for cold intolerance, heat intolerance, polydipsia, polyphagia and polyuria.  Genitourinary: Negative for dysuria, flank pain, frequency, hematuria and urgency.  Musculoskeletal: Negative for arthralgias, back pain and myalgias.  Skin: Negative for rash.  Allergic/Immunologic: Negative for environmental allergies and food allergies.  Neurological: Negative for dizziness, weakness, light-headedness, numbness and headaches.  Hematological: Negative for adenopathy. Does not bruise/bleed easily.  Psychiatric/Behavioral: Negative for dysphoric mood. The patient is not nervous/anxious.     Patient Active Problem List   Diagnosis Date Noted  . Special screening for malignant neoplasms, colon   . Benign neoplasm of transverse colon     No Known Allergies  Past Surgical History:  Procedure Laterality Date  . APPENDECTOMY    . BREAST ENHANCEMENT SURGERY     with GRS  . COLONOSCOPY WITH PROPOFOL N/A 03/10/2015   Procedure: COLONOSCOPY WITH PROPOFOL;  Surgeon: Lucilla Lame, MD;  Location: [place];  Service: Endoscopy;  Laterality: N/A;  . POLYPECTOMY  03/10/2015   Procedure: POLYPECTOMY;  Surgeon: Lucilla Lame, MD;  Location: [place];  Service: Endoscopy;;    Social History    Tobacco Use  . Smoking status: Never Smoker  . Smokeless tobacco: Never Used  Substance Use Topics  . Alcohol use: Yes    Alcohol/week: 1.0 standard drinks    Types: 1 Glasses of wine per week  . Drug use: No     Medication list has been reviewed and updated.  Current Meds  Medication Sig  . aspirin 325 MG tablet Take 325 mg by mouth daily.  Marland Kitchen estradiol (ESTRACE) 2 MG tablet TAKE 1 TABLET (2 MG TOTAL) BY MOUTH 2 (TWO) TIMES DAILY.  . Multiple Vitamins-Minerals (CENTRUM ADULTS PO) Take 1 tablet by mouth daily.    PHQ 2/9 Scores 10/27/2017 01/21/2017 02/10/2015  PHQ - 2 Score 0 0 0  PHQ- 9 Score 0 0 -    Physical Exam  Constitutional: She is oriented to person, place, and time.  HENT:  Head: Normocephalic.  Right Ear: External ear normal.  Left Ear: External ear normal.  Nose: Nose normal.  Mouth/Throat: Oropharynx is clear and moist.  Eyes: Pupils are equal, round, and reactive to light. Conjunctivae and EOM are normal. Right eye exhibits no discharge. Left eye exhibits no discharge. No scleral icterus.  Neck: Normal range of motion. Neck supple. No JVD present. No tracheal deviation present. No thyromegaly present.  Cardiovascular: Normal rate, regular rhythm, normal heart sounds and intact distal pulses. Exam reveals no gallop and no friction rub.  No murmur heard. Pulmonary/Chest: Breath sounds normal. No respiratory distress. She has no wheezes. She has no rales.  Abdominal: Soft. Bowel sounds are normal. She exhibits no mass. There is no hepatosplenomegaly. There is no tenderness. There is no rebound, no guarding and no CVA tenderness.  Musculoskeletal: Normal range of motion. She exhibits no edema  or tenderness.  Lymphadenopathy:    She has no cervical adenopathy.  Neurological: She is alert and oriented to person, place, and time. She has normal strength and normal reflexes. No cranial nerve deficit.  Skin: Skin is warm. No rash noted.  Nursing note and vitals  reviewed.   BP 130/80   Pulse 68   Ht 5\' 10"  (1.778 m)   Wt 190 lb (86.2 kg)   BMI 27.26 kg/m   Assessment and Plan:  1. Female hypogonadism Will supplement with estradiol 4 mg daily. - estradiol (ESTRACE) 2 MG tablet; Take 1 tablet (2 mg total) by mouth 2 (two) times daily.  Dispense: 180 tablet; Refill: 3  2. Flu vaccine need Discussed and administered - Flu Vaccine QUAD 6+ mos PF IM (Fluarix Quad PF)   Dr. Otilio Miu [facility]  10/27/2017

## 2017-10-27 NOTE — Patient Instructions (Signed)

## 2018-01-23 ENCOUNTER — Encounter: Payer: BLUE CROSS/BLUE SHIELD | Admitting: Family Medicine

## 2018-01-26 ENCOUNTER — Ambulatory Visit (INDEPENDENT_AMBULATORY_CARE_PROVIDER_SITE_OTHER): Payer: BLUE CROSS/BLUE SHIELD | Admitting: Family Medicine

## 2018-01-26 ENCOUNTER — Encounter: Payer: Self-pay | Admitting: Family Medicine

## 2018-01-26 VITALS — BP 130/70 | HR 68 | Ht 69.0 in | Wt 190.0 lb

## 2018-01-26 DIAGNOSIS — Z0000 Encounter for general adult medical examination without abnormal findings: Secondary | ICD-10-CM

## 2018-01-26 DIAGNOSIS — E2839 Other primary ovarian failure: Secondary | ICD-10-CM | POA: Diagnosis not present

## 2018-01-26 DIAGNOSIS — Z1231 Encounter for screening mammogram for malignant neoplasm of breast: Secondary | ICD-10-CM | POA: Diagnosis not present

## 2018-01-26 MED ORDER — ESTRADIOL 2 MG PO TABS: 2.0000 mg | ORAL_TABLET | Freq: Two times a day (BID) | ORAL | 3 refills | Status: DC

## 2018-01-26 NOTE — Progress Notes (Signed)
Date:  01/26/2018   Name:  Christy Wright   DOB:  1965-04-09   MRN:  [id number]   Chief Complaint: Annual Exam  Patient is a 53 year old female who presents for a comprehensive physical exam. The patient reports the following problems: none. Health maintenance has been reviewed up to date.   Review of Systems  Constitutional: Negative.  Negative for chills, fatigue, fever and unexpected weight change.  HENT: Negative for congestion, ear discharge, ear pain, rhinorrhea, sinus pressure, sneezing and sore throat.   Eyes: Negative for photophobia, pain, discharge, redness and itching.  Respiratory: Negative for cough, shortness of breath, wheezing and stridor.   Gastrointestinal: Negative for abdominal pain, blood in stool, constipation, diarrhea, nausea and vomiting.  Endocrine: Negative for cold intolerance, heat intolerance, polydipsia, polyphagia and polyuria.  Genitourinary: Negative for dysuria, flank pain, frequency, hematuria and urgency.  Musculoskeletal: Negative for arthralgias, back pain and myalgias.  Skin: Negative for rash.  Allergic/Immunologic: Negative for environmental allergies and food allergies.  Neurological: Negative for dizziness, weakness, light-headedness, numbness and headaches.  Hematological: Negative for adenopathy. Does not bruise/bleed easily.  Psychiatric/Behavioral: Negative for dysphoric mood. The patient is not nervous/anxious.     Patient Active Problem List   Diagnosis Date Noted  . Special screening for malignant neoplasms, colon   . Benign neoplasm of transverse colon     No Known Allergies  Past Surgical History:  Procedure Laterality Date  . APPENDECTOMY    . BREAST ENHANCEMENT SURGERY     with GRS  . COLONOSCOPY WITH PROPOFOL N/A 03/10/2015   Procedure: COLONOSCOPY WITH PROPOFOL;  Surgeon: Lucilla Lame, MD;  Location: [place];  Service: Endoscopy;  Laterality: N/A;  . POLYPECTOMY  03/10/2015   Procedure: POLYPECTOMY;   Surgeon: Lucilla Lame, MD;  Location: [place];  Service: Endoscopy;;    Social History   Tobacco Use  . Smoking status: Never Smoker  . Smokeless tobacco: Never Used  Substance Use Topics  . Alcohol use: Yes    Alcohol/week: 1.0 standard drinks    Types: 1 Glasses of wine per week  . Drug use: No     Medication list has been reviewed and updated.  Current Meds  Medication Sig  . aspirin 325 MG tablet Take 325 mg by mouth daily.  Marland Kitchen estradiol (ESTRACE) 2 MG tablet Take 1 tablet (2 mg total) by mouth 2 (two) times daily.  . Multiple Vitamins-Minerals (CENTRUM ADULTS PO) Take 1 tablet by mouth daily.    PHQ 2/9 Scores 10/27/2017 01/21/2017 02/10/2015  PHQ - 2 Score 0 0 0  PHQ- 9 Score 0 0 -    Physical Exam Vitals signs and nursing note reviewed.  Constitutional:      Appearance: Normal appearance. She is well-developed and normal weight.  HENT:     Head: Normocephalic.     Jaw: There is normal jaw occlusion.     Right Ear: Tympanic membrane, ear canal and external ear normal.     Left Ear: Tympanic membrane, ear canal and external ear normal.     Nose: Nose normal. No congestion or rhinorrhea.  Eyes:     General: Lids are normal. No scleral icterus.       Right eye: No discharge.        Left eye: No discharge.     Conjunctiva/sclera: Conjunctivae normal.     Pupils: Pupils are equal, round, and reactive to light. Pupils are equal.     Funduscopic  exam:    Right eye: No AV nicking, arteriolar narrowing or papilledema.        Left eye: No AV nicking, arteriolar narrowing or papilledema.  Neck:     Musculoskeletal: Normal range of motion and neck supple. No edema or erythema.     Thyroid: No thyromegaly.     Vascular: No JVD.     Trachea: No tracheal deviation.  Cardiovascular:     Rate and Rhythm: Normal rate and regular rhythm.  No extrasystoles are present.    Chest Wall: PMI is not displaced. No thrill.     Pulses: Normal pulses. No decreased pulses.           Carotid pulses are 2+ on the right side and 2+ on the left side.      Radial pulses are 2+ on the right side and 2+ on the left side.       Femoral pulses are 2+ on the right side and 2+ on the left side.      Popliteal pulses are 2+ on the right side and 2+ on the left side.       Dorsalis pedis pulses are 2+ on the right side and 2+ on the left side.       Posterior tibial pulses are 2+ on the right side and 2+ on the left side.     Heart sounds: Normal heart sounds. No murmur. No friction rub. No gallop.   Pulmonary:     Effort: No accessory muscle usage or respiratory distress.     Breath sounds: Normal breath sounds. No decreased air movement. No decreased breath sounds, wheezing, rhonchi or rales.  Chest:     Chest wall: No mass.     Breasts: Breasts are symmetrical.        Right: No swelling, bleeding, inverted nipple, mass, nipple discharge, skin change or tenderness.        Left: No swelling, bleeding, inverted nipple, mass, nipple discharge, skin change or tenderness.  Abdominal:     General: Abdomen is flat. Bowel sounds are normal.     Palpations: Abdomen is soft. There is no hepatomegaly, splenomegaly or mass.     Tenderness: There is no abdominal tenderness. There is no guarding or rebound.     Hernia: There is no hernia in the right inguinal area or left inguinal area.  Genitourinary:    General: Normal vulva.     Rectum: Normal. Guaiac result negative.     Comments: Prostate normal Musculoskeletal: Normal range of motion.        General: No tenderness.  Lymphadenopathy:     Cervical: No cervical adenopathy.     Right cervical: No superficial cervical adenopathy.    Left cervical: No superficial cervical adenopathy.  Skin:    General: Skin is warm.     Capillary Refill: Capillary refill takes less than 2 seconds.     Findings: No rash.  Neurological:     General: No focal deficit present.     Mental Status: She is alert and oriented to person, place, and  time.     Cranial Nerves: Cranial nerves are intact. No cranial nerve deficit.     Motor: Motor function is intact.     Deep Tendon Reflexes: Reflexes are normal and symmetric.     BP 130/70   Pulse 68   Ht 5\' 9"  (1.753 m)   Wt 190 lb (86.2 kg)   BMI 28.06 kg/m  sched mammo for 02/16/2018 @  8:20 in [facility] and Plan: 1. Annual physical exam No subjective/objective concerns noted during the history nor the physical exam.  Will check renal function panel and lipid panel. - Renal Function Panel - Lipid panel  2. Breast cancer screening by mammogram sreening exam done.  Scheduled milligrams in 3D - MM 3D SCREEN BREAST W/IMPLANT BILATERAL; Future

## 2018-01-27 LAB — LIPID PANEL
CHOLESTEROL TOTAL: 243 mg/dL — AB (ref 100–199)
Chol/HDL Ratio: 3.4 ratio (ref 0.0–4.4)
HDL: 72 mg/dL (ref >39)
LDL Calculated: 152 mg/dL — ABNORMAL HIGH (ref 0–99)
Triglycerides: 95 mg/dL (ref 0–149)
VLDL CHOLESTEROL CAL: 19 mg/dL (ref 5–40)

## 2018-01-27 LAB — RENAL FUNCTION PANEL
ALBUMIN: 4.5 g/dL (ref 3.5–5.5)
BUN/Creatinine Ratio: 19 (ref 9–23)
BUN: 15 mg/dL (ref 6–24)
CO2: 27 mmol/L (ref 20–29)
CREATININE: 0.8 mg/dL (ref 0.57–1.00)
Calcium: 9.6 mg/dL (ref 8.7–10.2)
Chloride: 102 mmol/L (ref 96–106)
GFR calc non Af Amer: 84 mL/min/{1.73_m2} (ref >59)
GFR, EST AFRICAN AMERICAN: 97 mL/min/{1.73_m2} (ref >59)
Glucose: 90 mg/dL (ref 65–99)
PHOSPHORUS: 3.3 mg/dL (ref 3.0–4.3)
Potassium: 4.3 mmol/L (ref 3.5–5.2)
Sodium: 141 mmol/L (ref 134–144)

## 2018-02-16 ENCOUNTER — Encounter: Payer: Self-pay | Admitting: Radiology

## 2018-02-16 ENCOUNTER — Ambulatory Visit
Admission: RE | Admit: 2018-02-16 | Discharge: 2018-02-16 | Disposition: A | Discharge status: Home / Self Care | Payer: BLUE CROSS/BLUE SHIELD | Source: Ambulatory Visit | Attending: Family Medicine | Admitting: Family Medicine

## 2018-02-16 DIAGNOSIS — Z1231 Encounter for screening mammogram for malignant neoplasm of breast: Secondary | ICD-10-CM | POA: Diagnosis not present

## 2019-01-25 ENCOUNTER — Other Ambulatory Visit: Payer: Self-pay | Admitting: Family Medicine

## 2019-01-25 DIAGNOSIS — E2839 Other primary ovarian failure: Secondary | ICD-10-CM

## 2019-02-10 ENCOUNTER — Other Ambulatory Visit: Payer: Self-pay | Admitting: Family Medicine

## 2019-02-10 DIAGNOSIS — E2839 Other primary ovarian failure: Secondary | ICD-10-CM

## 2019-03-20 ENCOUNTER — Other Ambulatory Visit: Payer: Self-pay | Admitting: Family Medicine

## 2019-03-20 DIAGNOSIS — Z1231 Encounter for screening mammogram for malignant neoplasm of breast: Secondary | ICD-10-CM

## 2019-03-21 ENCOUNTER — Ambulatory Visit (INDEPENDENT_AMBULATORY_CARE_PROVIDER_SITE_OTHER): Payer: BC Managed Care – PPO | Admitting: Family Medicine

## 2019-03-21 ENCOUNTER — Ambulatory Visit
Admission: RE | Admit: 2019-03-21 | Discharge: 2019-03-21 | Disposition: A | Discharge status: Home / Self Care | Payer: BC Managed Care – PPO | Source: Ambulatory Visit | Attending: Family Medicine | Admitting: Family Medicine

## 2019-03-21 ENCOUNTER — Encounter: Payer: Self-pay | Admitting: Family Medicine

## 2019-03-21 ENCOUNTER — Other Ambulatory Visit: Payer: Self-pay

## 2019-03-21 VITALS — BP 120/80 | HR 76 | Ht 69.0 in | Wt 164.0 lb

## 2019-03-21 DIAGNOSIS — Z1231 Encounter for screening mammogram for malignant neoplasm of breast: Secondary | ICD-10-CM | POA: Diagnosis not present

## 2019-03-21 DIAGNOSIS — R69 Illness, unspecified: Secondary | ICD-10-CM | POA: Diagnosis not present

## 2019-03-21 DIAGNOSIS — E2839 Other primary ovarian failure: Secondary | ICD-10-CM

## 2019-03-21 DIAGNOSIS — E7801 Familial hypercholesterolemia: Secondary | ICD-10-CM | POA: Diagnosis not present

## 2019-03-21 MED ORDER — ESTRADIOL 2 MG PO TABS: 2.0000 mg | ORAL_TABLET | Freq: Two times a day (BID) | ORAL | 3 refills | Status: DC

## 2019-03-21 NOTE — Progress Notes (Signed)
Date:  03/21/2019   Name:  Christy Wright   DOB:  09/01/1965   MRN:  [id number]   Chief Complaint: hormone replacement therapy and Hyperlipidemia (total and LDL were elevated last visit)  Patient is a 54 year old female who presents for a hormone rereplacement exam. The patient reports the following problems: up to date. Health maintenance has been reviewed up to date.  Hyperlipidemia This is a chronic problem. The current episode started more than 1 year ago. The problem is controlled. Recent lipid tests were reviewed and are normal. She has no history of chronic renal disease, diabetes, hypothyroidism, liver disease, obesity or nephrotic syndrome. There are no known factors aggravating her hyperlipidemia. Pertinent negatives include no chest pain, focal sensory loss, focal weakness, leg pain, myalgias or shortness of breath.    Lab Results  Component Value Date   CREATININE 0.80 01/26/2018   BUN 15 01/26/2018   NA 141 01/26/2018   K 4.3 01/26/2018   CL 102 01/26/2018   CO2 27 01/26/2018   Lab Results  Component Value Date   CHOL 243 (H) 01/26/2018   HDL 72 01/26/2018   LDLCALC 152 (H) 01/26/2018   TRIG 95 01/26/2018   CHOLHDL 3.4 01/26/2018   No results found for: TSH No results found for: HGBA1C   Review of Systems  Constitutional: Negative.  Negative for chills, fatigue, fever and unexpected weight change.  HENT: Negative for congestion, ear discharge, ear pain, rhinorrhea, sinus pressure, sneezing and sore throat.   Eyes: Negative for photophobia, pain, discharge, redness and itching.  Respiratory: Negative for cough, shortness of breath, wheezing and stridor.   Cardiovascular: Negative for chest pain.  Gastrointestinal: Negative for abdominal pain, blood in stool, constipation, diarrhea, nausea and vomiting.  Endocrine: Negative for cold intolerance, heat intolerance, polydipsia, polyphagia and polyuria.  Genitourinary: Negative for dysuria, flank pain, frequency,  hematuria, menstrual problem, pelvic pain, urgency, vaginal bleeding and vaginal discharge.  Musculoskeletal: Negative for arthralgias, back pain and myalgias.  Skin: Negative for rash.  Allergic/Immunologic: Negative for environmental allergies and food allergies.  Neurological: Negative for dizziness, focal weakness, weakness, light-headedness, numbness and headaches.  Hematological: Negative for adenopathy. Does not bruise/bleed easily.  Psychiatric/Behavioral: Negative for dysphoric mood. The patient is not nervous/anxious.     Patient Active Problem List   Diagnosis Date Noted  . Special screening for malignant neoplasms, colon   . Benign neoplasm of transverse colon     No Known Allergies  Past Surgical History:  Procedure Laterality Date  . APPENDECTOMY    . AUGMENTATION MAMMAPLASTY Bilateral 1994  . BREAST ENHANCEMENT SURGERY     with GRS  . COLONOSCOPY WITH PROPOFOL N/A 03/10/2015   Procedure: COLONOSCOPY WITH PROPOFOL;  Surgeon: Lucilla Lame, MD;  Location: [facility];  Service: Endoscopy;  Laterality: N/A;  . POLYPECTOMY  03/10/2015   Procedure: POLYPECTOMY;  Surgeon: Lucilla Lame, MD;  Location: [place];  Service: Endoscopy;;    Social History   Tobacco Use  . Smoking status: Never Smoker  . Smokeless tobacco: Never Used  Substance Use Topics  . Alcohol use: Yes    Alcohol/week: 1.0 standard drinks    Types: 1 Glasses of wine per week  . Drug use: No     Medication list has been reviewed and updated.  Current Meds  Medication Sig  . aspirin 325 MG tablet Take 325 mg by mouth daily.  Marland Kitchen estradiol (ESTRACE) 2 MG tablet TAKE 1 TABLET (2 MG  TOTAL) BY MOUTH 2 (TWO) TIMES DAILY.  . Multiple Vitamins-Minerals (CENTRUM ADULTS PO) Take 1 tablet by mouth daily.    PHQ 2/9 Scores 03/21/2019 10/27/2017 01/21/2017 02/10/2015  PHQ - 2 Score 0 0 0 0  PHQ- 9 Score 0 0 0 -    BP Readings from Last 3 Encounters:  03/21/19 120/80  01/26/18 130/70   10/27/17 130/80    Physical Exam Constitutional:      General: She is not in acute distress.    Appearance: She is not diaphoretic.  HENT:     Head: Normocephalic and atraumatic.     Right Ear: Tympanic membrane, ear canal and external ear normal.     Left Ear: Tympanic membrane, ear canal and external ear normal.     Nose: Nose normal. No congestion or rhinorrhea.  Eyes:     General:        Right eye: No discharge.        Left eye: No discharge.     Conjunctiva/sclera: Conjunctivae normal.     Pupils: Pupils are equal, round, and reactive to light.  Neck:     Thyroid: No thyromegaly.     Vascular: No JVD.  Cardiovascular:     Rate and Rhythm: Normal rate and regular rhythm.     Heart sounds: Normal heart sounds. No murmur. No friction rub. No gallop.   Pulmonary:     Effort: Pulmonary effort is normal.     Breath sounds: Normal breath sounds. No wheezing, rhonchi or rales.  Chest:     Chest wall: No tenderness.  Abdominal:     General: Bowel sounds are normal.     Palpations: Abdomen is soft. There is no mass.     Tenderness: There is no abdominal tenderness. There is no guarding.  Musculoskeletal:        General: Normal range of motion.     Cervical back: Normal range of motion and neck supple.  Lymphadenopathy:     Cervical: No cervical adenopathy.  Skin:    General: Skin is warm and dry.     Capillary Refill: Capillary refill takes less than 2 seconds.     Findings: No bruising or erythema.  Neurological:     Mental Status: She is alert.     Deep Tendon Reflexes: Reflexes are normal and symmetric.     Wt Readings from Last 3 Encounters:  03/21/19 164 lb (74.4 kg)  01/26/18 190 lb (86.2 kg)  10/27/17 190 lb (86.2 kg)    BP 120/80   Pulse 76   Ht 5\' 9"  (1.753 m)   Wt 164 lb (74.4 kg)   BMI 24.22 kg/m   Assessment and Plan: 1. Female hypogonadism She has a history of decrease estrogen.  She is supplemented with estradiol 2 mg twice a day. - estradiol  (ESTRACE) 2 MG tablet; Take 1 tablet (2 mg total) by mouth 2 (two) times daily.  Dispense: 180 tablet; Refill: 3  2. Familial hypercholesterolemia Chronic.  Controlled.  Stable.  Currently controlled with dietary will recheck lipid panel - Lipid Panel With LDL/HDL Ratio  3. Taking medication for chronic disease Chronic.  Controlled.  Stable.  We will check renal function panel to assess GFR. - Renal Function Panel

## 2019-03-22 LAB — LIPID PANEL WITH LDL/HDL RATIO
Cholesterol, Total: 243 mg/dL — ABNORMAL HIGH (ref 100–199)
HDL: 94 mg/dL (ref >39)
LDL Chol Calc (NIH): 138 mg/dL — ABNORMAL HIGH (ref 0–99)
LDL/HDL Ratio: 1.5 ratio (ref 0.0–3.2)
Triglycerides: 65 mg/dL (ref 0–149)
VLDL Cholesterol Cal: 11 mg/dL (ref 5–40)

## 2019-03-22 LAB — RENAL FUNCTION PANEL
Albumin: 4.7 g/dL (ref 3.8–4.9)
BUN/Creatinine Ratio: 17 (ref 9–23)
BUN: 17 mg/dL (ref 6–24)
CO2: 25 mmol/L (ref 20–29)
Calcium: 9.5 mg/dL (ref 8.7–10.2)
Chloride: 102 mmol/L (ref 96–106)
Creatinine, Ser: 0.98 mg/dL (ref 0.57–1.00)
GFR calc Af Amer: 75 mL/min/{1.73_m2} (ref >59)
GFR calc non Af Amer: 65 mL/min/{1.73_m2} (ref >59)
Glucose: 90 mg/dL (ref 65–99)
Phosphorus: 3.7 mg/dL (ref 3.0–4.3)
Potassium: 4.3 mmol/L (ref 3.5–5.2)
Sodium: 143 mmol/L (ref 134–144)

## 2019-08-27 ENCOUNTER — Telehealth: Payer: Self-pay | Admitting: Family Medicine

## 2019-08-27 NOTE — Telephone Encounter (Signed)
Copied from [place] [id number]. Topic: General - Inquiry >> Aug 27, 2019 12:05 PM Scherrie Gerlach wrote: Reason for CRM: pt wanted to speak with [name].  She preceded to tell me she has a toe fungus in 3 of her toes.  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  Would like Dr Ronnald Ramp to call in something, rather than she do OTC.  Would like Tara to cb and discuss

## 2019-08-29 NOTE — Telephone Encounter (Signed)
Dr Ronnald Ramp cannot call anything in without seeing her in person first. Please call the patient and schedule an appt for today or tomorrow.  Thank you.  CM

## 2019-09-05 ENCOUNTER — Ambulatory Visit (INDEPENDENT_AMBULATORY_CARE_PROVIDER_SITE_OTHER): Payer: BC Managed Care – PPO | Admitting: Family Medicine

## 2019-09-05 ENCOUNTER — Other Ambulatory Visit: Payer: Self-pay

## 2019-09-05 ENCOUNTER — Encounter: Payer: Self-pay | Admitting: Family Medicine

## 2019-09-05 ENCOUNTER — Other Ambulatory Visit
Admission: RE | Admit: 2019-09-05 | Discharge: 2019-09-05 | Disposition: A | Discharge status: Home / Self Care | Payer: BC Managed Care – PPO | Attending: Family Medicine | Admitting: Family Medicine

## 2019-09-05 VITALS — BP 100/60 | HR 80 | Ht 69.0 in | Wt 166.0 lb

## 2019-09-05 DIAGNOSIS — R69 Illness, unspecified: Secondary | ICD-10-CM | POA: Insufficient documentation

## 2019-09-05 DIAGNOSIS — Z23 Encounter for immunization: Secondary | ICD-10-CM | POA: Diagnosis not present

## 2019-09-05 DIAGNOSIS — Z79899 Other long term (current) drug therapy: Secondary | ICD-10-CM | POA: Insufficient documentation

## 2019-09-05 DIAGNOSIS — E2839 Other primary ovarian failure: Secondary | ICD-10-CM

## 2019-09-05 LAB — RENAL FUNCTION PANEL
Albumin: 4.2 g/dL (ref 3.5–5.0)
Anion gap: 9 (ref 5–15)
BUN: 20 mg/dL (ref 6–20)
CO2: 27 mmol/L (ref 22–32)
Calcium: 9 mg/dL (ref 8.9–10.3)
Chloride: 102 mmol/L (ref 98–111)
Creatinine, Ser: 0.89 mg/dL (ref 0.44–1.00)
GFR calc Af Amer: >60 mL/min (ref >60)
GFR calc non Af Amer: >60 mL/min (ref >60)
Glucose, Bld: 98 mg/dL (ref 70–99)
Phosphorus: 3.7 mg/dL (ref 2.5–4.6)
Potassium: 4.3 mmol/L (ref 3.5–5.1)
Sodium: 138 mmol/L (ref 135–145)

## 2019-09-05 LAB — LIPID PANEL
Cholesterol: 226 mg/dL — ABNORMAL HIGH (ref 0–200)
HDL: 103 mg/dL (ref >40)
LDL Cholesterol: 112 mg/dL — ABNORMAL HIGH (ref 0–99)
Total CHOL/HDL Ratio: 2.2 RATIO
Triglycerides: 53 mg/dL (ref <150)
VLDL: 11 mg/dL (ref 0–40)

## 2019-09-05 LAB — HEPATIC FUNCTION PANEL
ALT: 17 U/L (ref 0–44)
AST: 23 U/L (ref 15–41)
Albumin: 4.3 g/dL (ref 3.5–5.0)
Alkaline Phosphatase: 45 U/L (ref 38–126)
Bilirubin, Direct: 0.1 mg/dL (ref 0.0–0.2)
Indirect Bilirubin: 0.7 mg/dL (ref 0.3–0.9)
Total Bilirubin: 0.8 mg/dL (ref 0.3–1.2)
Total Protein: 7.3 g/dL (ref 6.5–8.1)

## 2019-09-05 MED ORDER — ESTRADIOL 2 MG PO TABS: 2.0000 mg | ORAL_TABLET | Freq: Two times a day (BID) | ORAL | 2 refills | Status: DC

## 2019-09-05 NOTE — Progress Notes (Signed)
Date:  09/05/2019   Name:  Christy Wright   DOB:  23-Feb-1965   MRN:  [id number]   Chief Complaint: hormone replacement therapy and Nail Problem (R) foot- 2 nails are involved)  Patient is a 54 year old female who presents for a hormone replacement exam. The patient reports the following problems: . Health maintenance has been reviewed up to date.   Lab Results  Component Value Date   CREATININE 0.98 03/21/2019   BUN 17 03/21/2019   NA 143 03/21/2019   K 4.3 03/21/2019   CL 102 03/21/2019   CO2 25 03/21/2019   Lab Results  Component Value Date   CHOL 243 (H) 03/21/2019   HDL 94 03/21/2019   LDLCALC 138 (H) 03/21/2019   TRIG 65 03/21/2019   CHOLHDL 3.4 01/26/2018   No results found for: TSH No results found for: HGBA1C No results found for: WBC, HGB, HCT, MCV, PLT Lab Results  Component Value Date   ALT 24 01/21/2017   AST 23 01/21/2017   ALKPHOS 60 01/21/2017   BILITOT 0.5 01/21/2017     Review of Systems  Constitutional: Negative.  Negative for chills, fatigue, fever and unexpected weight change.  HENT: Negative for congestion, ear discharge, ear pain, rhinorrhea, sinus pressure, sneezing and sore throat.   Eyes: Negative for photophobia, pain, discharge, redness and itching.  Respiratory: Negative for cough, shortness of breath, wheezing and stridor.   Gastrointestinal: Negative for abdominal pain, blood in stool, constipation, diarrhea, nausea and vomiting.  Endocrine: Negative for cold intolerance, heat intolerance, polydipsia, polyphagia and polyuria.  Genitourinary: Negative for dysuria, flank pain, frequency, hematuria, menstrual problem, pelvic pain, urgency, vaginal bleeding and vaginal discharge.  Musculoskeletal: Negative for arthralgias, back pain and myalgias.  Skin: Negative for rash.  Allergic/Immunologic: Negative for environmental allergies and food allergies.  Neurological: Negative for dizziness, weakness, light-headedness, numbness and  headaches.  Hematological: Negative for adenopathy. Does not bruise/bleed easily.  Psychiatric/Behavioral: Negative for dysphoric mood. The patient is not nervous/anxious.     Patient Active Problem List   Diagnosis Date Noted  . Special screening for malignant neoplasms, colon   . Benign neoplasm of transverse colon     No Known Allergies  Past Surgical History:  Procedure Laterality Date  . APPENDECTOMY    . AUGMENTATION MAMMAPLASTY Bilateral 1994  . BREAST ENHANCEMENT SURGERY     with GRS  . COLONOSCOPY WITH PROPOFOL N/A 03/10/2015   Procedure: COLONOSCOPY WITH PROPOFOL;  Surgeon: Lucilla Lame, MD;  Location: [facility];  Service: Endoscopy;  Laterality: N/A;  . POLYPECTOMY  03/10/2015   Procedure: POLYPECTOMY;  Surgeon: Lucilla Lame, MD;  Location: [place];  Service: Endoscopy;;    Social History   Tobacco Use  . Smoking status: Never Smoker  . Smokeless tobacco: Never Used  Substance Use Topics  . Alcohol use: Yes    Alcohol/week: 1.0 standard drink    Types: 1 Glasses of wine per week  . Drug use: No     Medication list has been reviewed and updated.  Current Meds  Medication Sig  . aspirin 325 MG tablet Take 325 mg by mouth daily.  Marland Kitchen estradiol (ESTRACE) 2 MG tablet Take 1 tablet (2 mg total) by mouth 2 (two) times daily.  . Multiple Vitamins-Minerals (CENTRUM ADULTS PO) Take 1 tablet by mouth daily.    PHQ 2/9 Scores 09/05/2019 03/21/2019 10/27/2017 01/21/2017  PHQ - 2 Score 0 0 0 0  PHQ- 9 Score 0  0 0 0    GAD 7 : Generalized Anxiety Score 09/05/2019 03/21/2019  Nervous, Anxious, on Edge 0 0  Control/stop worrying 0 0  Worry too much - different things 0 0  Trouble relaxing 0 0  Restless 0 0  Easily annoyed or irritable 0 0  Afraid - awful might happen 0 0  Total GAD 7 Score 0 0    BP Readings from Last 3 Encounters:  09/05/19 100/60  03/21/19 120/80  01/26/18 130/70    Physical Exam Vitals and nursing note reviewed.    Constitutional:      General: She is not in acute distress.    Appearance: She is not diaphoretic.  HENT:     Head: Normocephalic and atraumatic.     Right Ear: Tympanic membrane, ear canal and external ear normal.     Left Ear: Tympanic membrane, ear canal and external ear normal.     Nose: Nose normal. No congestion or rhinorrhea.     Mouth/Throat:     Mouth: Mucous membranes are moist.     Pharynx: No oropharyngeal exudate or posterior oropharyngeal erythema.  Eyes:     General:        Right eye: No discharge.        Left eye: No discharge.     Conjunctiva/sclera: Conjunctivae normal.     Pupils: Pupils are equal, round, and reactive to light.  Neck:     Thyroid: No thyromegaly.     Vascular: No JVD.  Cardiovascular:     Rate and Rhythm: Normal rate and regular rhythm.     Heart sounds: Normal heart sounds. No murmur heard.  No friction rub. No gallop.   Pulmonary:     Effort: Pulmonary effort is normal.     Breath sounds: Normal breath sounds. No wheezing, rhonchi or rales.  Chest:     Chest wall: No tenderness.  Abdominal:     General: Bowel sounds are normal.     Palpations: Abdomen is soft. There is no mass.     Tenderness: There is no abdominal tenderness. There is no guarding or rebound.  Musculoskeletal:        General: Normal range of motion.     Cervical back: Normal range of motion and neck supple.  Lymphadenopathy:     Cervical: No cervical adenopathy.  Skin:    General: Skin is warm and dry.  Neurological:     Mental Status: She is alert.     Deep Tendon Reflexes: Reflexes are normal and symmetric.     Wt Readings from Last 3 Encounters:  09/05/19 166 lb (75.3 kg)  03/21/19 164 lb (74.4 kg)  01/26/18 190 lb (86.2 kg)    BP 100/60   Pulse 80   Ht 5\' 9"  (1.753 m)   Wt 166 lb (75.3 kg)   BMI 24.51 kg/m   Assessment and Plan: 1. Female hypogonadism Chronic.  Controlled.  Stable.  Patient is doing very well on current dosing of estradiol 2 mg 1  tablet twice a day.  We will continue at current dosing and recheck in 6 to 9 months. - estradiol (ESTRACE) 2 MG tablet; Take 1 tablet (2 mg total) by mouth 2 (two) times daily.  Dispense: 180 tablet; Refill: 2  2. Taking medication for chronic disease Patient is on medication which may have side effects.  We will check renal function panel hepatic function panel lipid panel for current status of health maintenance. - Renal Function Panel -  Hepatic Function Panel (6) - Lipid Panel With LDL/HDL Ratio  3. Flu vaccine need Discussed and administered. - Flu Vaccine QUAD 6+ mos PF IM (Fluarix Quad PF)

## 2020-02-18 ENCOUNTER — Other Ambulatory Visit: Payer: Self-pay | Admitting: Family Medicine

## 2020-02-18 DIAGNOSIS — Z1231 Encounter for screening mammogram for malignant neoplasm of breast: Secondary | ICD-10-CM

## 2020-02-19 ENCOUNTER — Ambulatory Visit: Payer: BC Managed Care – PPO | Admitting: Family Medicine

## 2020-02-21 ENCOUNTER — Ambulatory Visit (INDEPENDENT_AMBULATORY_CARE_PROVIDER_SITE_OTHER): Payer: BC Managed Care – PPO | Admitting: Family Medicine

## 2020-02-21 ENCOUNTER — Encounter: Payer: Self-pay | Admitting: Family Medicine

## 2020-02-21 ENCOUNTER — Other Ambulatory Visit: Payer: Self-pay

## 2020-02-21 VITALS — BP 120/80 | HR 76 | Ht 69.0 in | Wt 178.0 lb

## 2020-02-21 DIAGNOSIS — Z23 Encounter for immunization: Secondary | ICD-10-CM

## 2020-02-21 DIAGNOSIS — E2839 Other primary ovarian failure: Secondary | ICD-10-CM | POA: Diagnosis not present

## 2020-02-21 MED ORDER — ESTRADIOL 2 MG PO TABS: 2.0000 mg | ORAL_TABLET | Freq: Two times a day (BID) | ORAL | 1 refills | Status: DC

## 2020-02-21 NOTE — Progress Notes (Signed)
Date:  02/21/2020   Name:  Christy Wright   DOB:  [birth year]   MRN:  [id number]   Chief Complaint: hormone replacement therapy  Patient is a 55 year old female who presents for a hormone refill exam. The patient reports the following problems: none. Health maintenance has been reviewed up to date.   Lab Results  Component Value Date   CREATININE 0.89 09/05/2019   BUN 20 09/05/2019   NA 138 09/05/2019   K 4.3 09/05/2019   CL 102 09/05/2019   CO2 27 09/05/2019   Lab Results  Component Value Date   CHOL 226 (H) 09/05/2019   HDL 103 09/05/2019   LDLCALC 112 (H) 09/05/2019   TRIG 53 09/05/2019   CHOLHDL 2.2 09/05/2019   No results found for: TSH No results found for: HGBA1C No results found for: WBC, HGB, HCT, MCV, PLT Lab Results  Component Value Date   ALT 17 09/05/2019   AST 23 09/05/2019   ALKPHOS 45 09/05/2019   BILITOT 0.8 09/05/2019     Review of Systems  Constitutional: Negative.  Negative for chills, fatigue, fever and unexpected weight change.  HENT: Negative for congestion, ear discharge, ear pain, rhinorrhea, sinus pressure, sneezing and sore throat.   Eyes: Negative for double vision, photophobia, pain, discharge, redness and itching.  Respiratory: Negative for cough, shortness of breath, wheezing and stridor.   Cardiovascular: Negative for chest pain, palpitations and leg swelling.  Gastrointestinal: Negative for abdominal pain, blood in stool, constipation, diarrhea, nausea and vomiting.  Endocrine: Negative for cold intolerance, heat intolerance, polydipsia, polyphagia and polyuria.  Genitourinary: Negative for dysuria, flank pain, frequency, hematuria, menstrual problem, pelvic pain, urgency, vaginal bleeding and vaginal discharge.  Musculoskeletal: Negative for arthralgias, back pain and myalgias.  Skin: Negative for rash.  Allergic/Immunologic: Negative for environmental allergies and food allergies.  Neurological: Negative for dizziness, weakness,  light-headedness, numbness and headaches.  Hematological: Negative for adenopathy. Does not bruise/bleed easily.  Psychiatric/Behavioral: Negative for dysphoric mood. The patient is not nervous/anxious.     Patient Active Problem List   Diagnosis Date Noted  . Special screening for malignant neoplasms, colon   . Benign neoplasm of transverse colon     No Known Allergies  Past Surgical History:  Procedure Laterality Date  . APPENDECTOMY    . AUGMENTATION MAMMAPLASTY Bilateral 1994  . BREAST ENHANCEMENT SURGERY     with GRS  . COLONOSCOPY WITH PROPOFOL N/A 03/10/2015   Procedure: COLONOSCOPY WITH PROPOFOL;  Surgeon: Lucilla Lame, MD;  Location: [place];  Service: Endoscopy;  Laterality: N/A;  . POLYPECTOMY  03/10/2015   Procedure: POLYPECTOMY;  Surgeon: Lucilla Lame, MD;  Location: [facility];  Service: Endoscopy;;    Social History   Tobacco Use  . Smoking status: Never Smoker  . Smokeless tobacco: Never Used  Substance Use Topics  . Alcohol use: Yes    Alcohol/week: 1.0 standard drink    Types: 1 Glasses of wine per week  . Drug use: No     Medication list has been reviewed and updated.  Current Meds  Medication Sig  . aspirin 325 MG tablet Take 325 mg by mouth daily.  Marland Kitchen estradiol (ESTRACE) 2 MG tablet Take 1 tablet (2 mg total) by mouth 2 (two) times daily.  . Multiple Vitamins-Minerals (CENTRUM ADULTS PO) Take 1 tablet by mouth daily.    PHQ 2/9 Scores 02/21/2020 09/05/2019 03/21/2019 10/27/2017  PHQ - 2 Score 0 0 0 0  PHQ-  9 Score 0 0 0 0    GAD 7 : Generalized Anxiety Score 02/21/2020 09/05/2019 03/21/2019  Nervous, Anxious, on Edge 0 0 0  Control/stop worrying 0 0 0  Worry too much - different things 0 0 0  Trouble relaxing 0 0 0  Restless 0 0 0  Easily annoyed or irritable 0 0 0  Afraid - awful might happen 0 0 0  Total GAD 7 Score 0 0 0    BP Readings from Last 3 Encounters:  02/21/20 120/80  09/05/19 100/60  03/21/19 120/80     Physical Exam Vitals and nursing note reviewed.  Constitutional:      Appearance: She is well-developed and well-nourished.  HENT:     Head: Normocephalic.     Right Ear: Tympanic membrane, ear canal and external ear normal. There is no impacted cerumen.     Left Ear: Tympanic membrane, ear canal and external ear normal. There is no impacted cerumen.     Nose: Nose normal. No congestion or rhinorrhea.     Mouth/Throat:     Mouth: Oropharynx is clear and moist. Mucous membranes are moist.  Eyes:     General: Lids are everted, no foreign bodies appreciated. No scleral icterus.       Left eye: No foreign body or hordeolum.     Extraocular Movements: EOM normal.     Conjunctiva/sclera: Conjunctivae normal.     Right eye: Right conjunctiva is not injected.     Left eye: Left conjunctiva is not injected.     Pupils: Pupils are equal, round, and reactive to light.  Neck:     Thyroid: No thyromegaly.     Vascular: No JVD.     Trachea: No tracheal deviation.  Cardiovascular:     Rate and Rhythm: Normal rate and regular rhythm.     Pulses: Intact distal pulses.     Heart sounds: Normal heart sounds. No murmur heard. No friction rub. No gallop.   Pulmonary:     Effort: Pulmonary effort is normal. No respiratory distress.     Breath sounds: Normal breath sounds. No wheezing, rhonchi or rales.  Abdominal:     General: Bowel sounds are normal.     Palpations: Abdomen is soft. There is no hepatosplenomegaly or mass.     Tenderness: There is no abdominal tenderness. There is no guarding or rebound.  Musculoskeletal:        General: No tenderness or edema. Normal range of motion.     Cervical back: Normal range of motion and neck supple.  Lymphadenopathy:     Cervical: No cervical adenopathy.  Skin:    General: Skin is warm.     Findings: No rash.  Neurological:     Mental Status: She is alert and oriented to person, place, and time.     Cranial Nerves: No cranial nerve deficit.      Deep Tendon Reflexes: Strength normal. Reflexes normal.  Psychiatric:        Mood and Affect: Mood and affect normal. Mood is not anxious or depressed.     Wt Readings from Last 3 Encounters:  02/21/20 178 lb (80.7 kg)  09/05/19 166 lb (75.3 kg)  03/21/19 164 lb (74.4 kg)    BP 120/80   Pulse 76   Ht 5\' 9"  (1.753 m)   Wt 178 lb (80.7 kg)   BMI 26.29 kg/m   Assessment and Plan: 1. Female hypogonadism Chronic.  Controlled.  Stable.  Continue estradiol  1 tablet twice a day. - estradiol (ESTRACE) 2 MG tablet; Take 1 tablet (2 mg total) by mouth 2 (two) times daily.  Dispense: 180 tablet; Refill: 1  2. Need for shingles vaccine New onset.  Patient is receiving shingles vaccine for prophylaxis. - Varicella-zoster vaccine IM (Shingrix)  We will proceed with scheduling of mammogram for screening of breast cancer and colonoscopy which has been 5 years from the previous exam and is to be repeated this year.

## 2020-03-26 ENCOUNTER — Inpatient Hospital Stay: Admission: RE | Admit: 2020-03-26 | Payer: BC Managed Care – PPO | Source: Ambulatory Visit

## 2020-04-22 ENCOUNTER — Telehealth: Payer: Self-pay

## 2020-04-22 NOTE — Telephone Encounter (Signed)
Please call and say ok

## 2020-04-22 NOTE — Telephone Encounter (Unsigned)
Copied from [place] [id number]. Topic: Appointment Scheduling - Scheduling Inquiry for Clinic >> Apr 22, 2020 12:00 PM Greggory Keen D wrote: Pt called asking if she can come in tomorrow am and get a Shingles shot.  She said to ask [name] to call her.  [phone]  She said she would be passing through and wanted to know if that ws ok

## 2020-04-22 NOTE — Telephone Encounter (Signed)
Pt stated she had mentioned coming in on the 28th not tomorrow. I went ahead and scheduled a nurse visit for the 28 th for her shingles shot

## 2020-05-08 ENCOUNTER — Other Ambulatory Visit: Payer: Self-pay

## 2020-05-08 ENCOUNTER — Ambulatory Visit (INDEPENDENT_AMBULATORY_CARE_PROVIDER_SITE_OTHER): Payer: BC Managed Care – PPO

## 2020-05-08 ENCOUNTER — Ambulatory Visit
Admission: RE | Admit: 2020-05-08 | Discharge: 2020-05-08 | Disposition: A | Discharge status: Home / Self Care | Payer: BC Managed Care – PPO | Source: Ambulatory Visit | Attending: Family Medicine | Admitting: Family Medicine

## 2020-05-08 DIAGNOSIS — Z23 Encounter for immunization: Secondary | ICD-10-CM

## 2020-05-08 DIAGNOSIS — Z1231 Encounter for screening mammogram for malignant neoplasm of breast: Secondary | ICD-10-CM | POA: Diagnosis not present

## 2020-05-14 ENCOUNTER — Other Ambulatory Visit: Payer: Self-pay

## 2020-05-14 ENCOUNTER — Telehealth: Payer: Self-pay

## 2020-05-14 DIAGNOSIS — Z1211 Encounter for screening for malignant neoplasm of colon: Secondary | ICD-10-CM

## 2020-05-14 NOTE — Progress Notes (Unsigned)
Ref placed for Dr Wohl/ colonoscopy

## 2020-05-14 NOTE — Telephone Encounter (Signed)
Spoke to pt and Christy Wright- ref placed for 5 year repeat

## 2020-05-14 NOTE — Telephone Encounter (Unsigned)
Copied from [place] [phone]. Topic: Referral - Status >> May 13, 2020  5:02 PM Yvette Rack wrote: Reason for CRM: Pt stated she called the number given to her by [name] but she was told that a letter of recommendation is needed since she has never been to that location. Pt stated she is confused because she thought she been referred to the location that she went to before. Pt requests [name] call her back

## 2020-06-03 ENCOUNTER — Other Ambulatory Visit: Payer: Self-pay | Admitting: Gastroenterology

## 2020-06-03 ENCOUNTER — Telehealth (INDEPENDENT_AMBULATORY_CARE_PROVIDER_SITE_OTHER): Payer: Self-pay | Admitting: Gastroenterology

## 2020-06-03 ENCOUNTER — Other Ambulatory Visit: Payer: Self-pay

## 2020-06-03 DIAGNOSIS — Z1211 Encounter for screening for malignant neoplasm of colon: Secondary | ICD-10-CM

## 2020-06-03 DIAGNOSIS — Z86010 Personal history of colonic polyps: Secondary | ICD-10-CM

## 2020-06-03 MED ORDER — CLENPIQ 10-3.5-12 MG-GM -GM/160ML PO SOLN: 320.0000 mL | Freq: Once | ORAL | 0 refills | Status: AC

## 2020-06-03 NOTE — Progress Notes (Signed)
Gastroenterology Pre-Procedure Review  Request Date: 06/19/2020 Requesting Physician: Dr. Allen Norris   PATIENT REVIEW QUESTIONS: The patient responded to the following health history questions as indicated:    1. Are you having any GI issues? no 2. Do you have a personal history of Polyps? Yes the last procedure was colon polyps  3. Do you have a family history of Colon Cancer or Polyps? no 4. Diabetes Mellitus? no 5. Joint replacements in the past 12 months?no 6. Major health problems in the past 3 months?no 7. Any artificial heart valves, MVP, or defibrillator?no    MEDICATIONS & ALLERGIES:    Patient reports the following regarding taking any anticoagulation/antiplatelet therapy:   Plavix, Coumadin, Eliquis, Xarelto, Lovenox, Pradaxa, Brilinta, or Effient? no Aspirin? no  Patient confirms/reports the following medications:  Current Outpatient Medications  Medication Sig Dispense Refill  . aspirin 325 MG tablet Take 325 mg by mouth daily.    Marland Kitchen estradiol (ESTRACE) 2 MG tablet Take 1 tablet (2 mg total) by mouth 2 (two) times daily. 180 tablet 1  . Multiple Vitamins-Minerals (CENTRUM ADULTS PO) Take 1 tablet by mouth daily.     No current facility-administered medications for this visit.    Patient confirms/reports the following allergies:  No Known Allergies  No orders of the defined types were placed in this encounter.   AUTHORIZATION INFORMATION Primary Insurance: 1D#: Group #:  Secondary Insurance: 1D#: Group #:  SCHEDULE INFORMATION: Date:  Time: Location:

## 2020-06-04 ENCOUNTER — Encounter: Payer: Self-pay | Admitting: Gastroenterology

## 2020-06-11 ENCOUNTER — Other Ambulatory Visit: Payer: Self-pay

## 2020-06-11 MED ORDER — PEG 3350-KCL-NA BICARB-NACL 420 G PO SOLR: ORAL | 0 refills | Status: DC

## 2020-06-16 ENCOUNTER — Telehealth: Payer: Self-pay

## 2020-06-16 NOTE — Telephone Encounter (Signed)
Pt lmovm requesting a call back with instructions on which medication she needs to take as another Rx was sent to the pharmacy 06/11/2020 by you... please advise, contact pt with update on change in medication please

## 2020-06-18 ENCOUNTER — Telehealth: Payer: Self-pay | Admitting: Gastroenterology

## 2020-06-18 ENCOUNTER — Telehealth: Payer: Self-pay

## 2020-06-18 NOTE — Discharge Instructions (Signed)

## 2020-06-18 NOTE — Telephone Encounter (Signed)
Patient states when she pick up the prep it was a gallon jug. Confirmed that this was correct and she is to drink  8oz every 20 to 30 minutes till it is gone

## 2020-06-18 NOTE — Telephone Encounter (Signed)
Called with where to go tomorrow

## 2020-06-18 NOTE — Telephone Encounter (Signed)
Patient has questions about prep, procedure is tomorrow, please call to advise

## 2020-06-18 NOTE — Telephone Encounter (Signed)
Copied from [place] [id number]. Topic: General - Other >> Jun 18, 2020 10:19 AM Christy Wright wrote: Reason for CRM: Pt would like to speak with [name] about her colonoscopy she has tomorrow./pt stated it is very important/ please advise

## 2020-06-19 ENCOUNTER — Ambulatory Visit
Admission: RE | Admit: 2020-06-19 | Discharge: 2020-06-19 | Disposition: A | Discharge status: Home / Self Care | Payer: BC Managed Care – PPO | Attending: Gastroenterology | Admitting: Gastroenterology

## 2020-06-19 ENCOUNTER — Other Ambulatory Visit: Payer: Self-pay

## 2020-06-19 ENCOUNTER — Ambulatory Visit: Payer: BC Managed Care – PPO | Admitting: Anesthesiology

## 2020-06-19 ENCOUNTER — Encounter: Payer: Self-pay | Admitting: Gastroenterology

## 2020-06-19 ENCOUNTER — Encounter
Admission: RE | Disposition: A | Discharge status: Home / Self Care | Payer: Self-pay | Source: Home / Self Care | Attending: Gastroenterology

## 2020-06-19 DIAGNOSIS — Z1211 Encounter for screening for malignant neoplasm of colon: Secondary | ICD-10-CM | POA: Insufficient documentation

## 2020-06-19 DIAGNOSIS — K640 First degree hemorrhoids: Secondary | ICD-10-CM | POA: Diagnosis not present

## 2020-06-19 DIAGNOSIS — Z860100 Personal history of colon polyps, unspecified: Secondary | ICD-10-CM

## 2020-06-19 DIAGNOSIS — Z79890 Hormone replacement therapy (postmenopausal): Secondary | ICD-10-CM | POA: Insufficient documentation

## 2020-06-19 HISTORY — PX: COLONOSCOPY WITH PROPOFOL: SHX5780

## 2020-06-19 SURGERY — COLONOSCOPY WITH PROPOFOL
Anesthesia: General | Site: Rectum

## 2020-06-19 MED ORDER — ACETAMINOPHEN 325 MG PO TABS: 325.0000 mg | ORAL_TABLET | Freq: Once | ORAL | Status: DC

## 2020-06-19 MED ORDER — PROPOFOL 10 MG/ML IV BOLUS
INTRAVENOUS | Status: DC | PRN
  Administered 2020-06-19 (×2): 30 mg via INTRAVENOUS
  Administered 2020-06-19: 150 mg via INTRAVENOUS
  Administered 2020-06-19: 30 mg via INTRAVENOUS

## 2020-06-19 MED ORDER — ACETAMINOPHEN 160 MG/5ML PO SOLN: 325.0000 mg | Freq: Once | ORAL | Status: DC

## 2020-06-19 MED ORDER — SODIUM CHLORIDE 0.9 % IV SOLN: INTRAVENOUS | Status: DC

## 2020-06-19 MED ORDER — STERILE WATER FOR IRRIGATION IR SOLN: Status: DC | PRN

## 2020-06-19 MED ORDER — LIDOCAINE HCL (CARDIAC) PF 100 MG/5ML IV SOSY
PREFILLED_SYRINGE | INTRAVENOUS | Status: DC | PRN
  Administered 2020-06-19: 30 mg via INTRAVENOUS

## 2020-06-19 MED ORDER — LACTATED RINGERS IV SOLN: INTRAVENOUS | Status: DC

## 2020-06-19 SURGICAL SUPPLY — 6 items
GOWN CVR UNV OPN BCK APRN NK (MISCELLANEOUS) ×2 IMPLANT
GOWN ISOL THUMB LOOP REG UNIV (MISCELLANEOUS) ×4
KIT PRC NS LF DISP ENDO (KITS) ×1 IMPLANT
KIT PROCEDURE OLYMPUS (KITS) ×2
MANIFOLD NEPTUNE II (INSTRUMENTS) ×2 IMPLANT
WATER STERILE IRR 250ML POUR (IV SOLUTION) ×2 IMPLANT

## 2020-06-19 NOTE — Op Note (Signed)
[facility] Gastroenterology Patient Name: Christy Wright Procedure Date: 06/19/2020 9:31 AM MRN: [id number] Account #: [id number] Date of Birth: 04-22-1965 Admit Type: Outpatient Age: 55 Room: [facility] OR ROOM 01 Gender: Female Note Status: Finalized Procedure:             Colonoscopy Indications:           High risk colon cancer surveillance: Personal history                         of colonic polyps Providers:             Lucilla Lame MD, MD Referring MD:          Juline Patch, MD (Referring MD) Medicines:             Propofol per Anesthesia Complications:         No immediate complications. Procedure:             Pre-Anesthesia Assessment:                        - Prior to the procedure, a History and Physical was                         performed, and patient medications and allergies were                         reviewed. The patient's tolerance of previous                         anesthesia was also reviewed. The risks and benefits                         of the procedure and the sedation options and risks                         were discussed with the patient. All questions were                         answered, and informed consent was obtained. Prior                         Anticoagulants: The patient has taken no previous                         anticoagulant or antiplatelet agents. ASA Grade                         Assessment: II - A patient with mild systemic disease.                         After reviewing the risks and benefits, the patient                         was deemed in satisfactory condition to undergo the                         procedure.  After obtaining informed consent, the colonoscope was                         passed under direct vision. Throughout the procedure,                         the patient's blood pressure, pulse, and oxygen                         saturations were monitored continuously. The                          Colonoscope was introduced through the anus and                         advanced to the the cecum, identified by appendiceal                         orifice and ileocecal valve. The colonoscopy was                         performed without difficulty. The patient tolerated                         the procedure well. The quality of the bowel                         preparation was excellent. Findings:      The perianal and digital rectal examinations were normal.      Non-bleeding internal hemorrhoids were found during retroflexion. The       hemorrhoids were Grade I (internal hemorrhoids that do not prolapse). Impression:            - Non-bleeding internal hemorrhoids.                        - No specimens collected. Recommendation:        - Discharge patient to home.                        - Resume previous diet.                        - Continue present medications.                        - Repeat colonoscopy in 7 years for surveillance. Procedure Code(s):     --- Professional ---                        [phone], Colonoscopy, flexible; diagnostic, including                         collection of specimen(s) by brushing or washing, when                         performed (separate procedure) Diagnosis Code(s):     --- Professional ---                        Z86.010, Personal history of colonic polyps  CPT copyright 2019 [employer]. All rights reserved. The codes documented in this report are preliminary and upon coder review may  be revised to meet current compliance requirements. Lucilla Lame MD, MD 06/19/2020 10:02:55 AM This report has been signed electronically. Number of Addenda: 0 Note Initiated On: 06/19/2020 9:31 AM Scope Withdrawal Time: 0 hours 7 minutes 44 seconds  Total Procedure Duration: 0 hours 16 minutes 12 seconds  Estimated Blood Loss:  Estimated blood loss: none.      [facility]

## 2020-06-19 NOTE — Transfer of Care (Signed)
Immediate Anesthesia Transfer of Care Note  Patient: Christy Wright  Procedure(s) Performed: COLONOSCOPY WITH PROPOFOL (Rectum)  Patient Location: PACU  Anesthesia Type: General  Level of Consciousness: awake, alert  and patient cooperative  Airway and Oxygen Therapy: Patient Spontanous Breathing and Patient connected to supplemental oxygen  Post-op Assessment: Post-op Vital signs reviewed, Patient's Cardiovascular Status Stable, Respiratory Function Stable, Patent Airway and No signs of Nausea or vomiting  Post-op Vital Signs: Reviewed and stable  Complications: No notable events documented.

## 2020-06-19 NOTE — Anesthesia Preprocedure Evaluation (Signed)
Anesthesia Evaluation  Patient identified by MRN, date of birth, ID band Patient awake    Reviewed: Allergy & Precautions, H&P , NPO status , Patient's Chart, lab work & pertinent test results  Airway Mallampati: II  TM Distance: >3 FB Neck ROM: full    Dental no notable dental hx.    Pulmonary    Pulmonary exam normal breath sounds clear to auscultation       Cardiovascular Normal cardiovascular exam Rhythm:regular Rate:Normal     Neuro/Psych    GI/Hepatic   Endo/Other    Renal/GU      Musculoskeletal   Abdominal   Peds  Hematology   Anesthesia Other Findings   Reproductive/Obstetrics                             Anesthesia Physical Anesthesia Plan  ASA: 2  Anesthesia Plan: General   Post-op Pain Management:    Induction: Intravenous  PONV Risk Score and Plan: 3 and Treatment may vary due to age or medical condition, Propofol infusion and TIVA  Airway Management Planned: Natural Airway  Additional Equipment:   Intra-op Plan:   Post-operative Plan:   Informed Consent: I have reviewed the patients History and Physical, chart, labs and discussed the procedure including the risks, benefits and alternatives for the proposed anesthesia with the patient or authorized representative who has indicated his/her understanding and acceptance.     Dental Advisory Given  Plan Discussed with: CRNA  Anesthesia Plan Comments:         Anesthesia Quick Evaluation

## 2020-06-19 NOTE — Anesthesia Procedure Notes (Signed)
Date/Time: 06/19/2020 9:42 AM Performed by: Cameron Ali, CRNA Pre-anesthesia Checklist: Patient identified, Emergency Drugs available, Suction available, Timeout performed and Patient being monitored Patient Re-evaluated:Patient Re-evaluated prior to induction Oxygen Delivery Method: Nasal cannula Placement Confirmation: positive ETCO2

## 2020-06-19 NOTE — H&P (Signed)
Lucilla Lame, MD [address] [place], [place] [address] Phone:[phone] Fax : [phone]  Primary Care Physician:  Juline Patch, MD Primary Gastroenterologist:  Dr. Allen Norris  Pre-Procedure History & Physical: HPI:  Christy Wright is a 55 y.o. female is here for an colonoscopy.   History reviewed. No pertinent past medical history.  Past Surgical History:  Procedure Laterality Date   APPENDECTOMY     AUGMENTATION MAMMAPLASTY Bilateral 1994   BREAST ENHANCEMENT SURGERY     with GRS   COLONOSCOPY WITH PROPOFOL N/A 03/10/2015   Procedure: COLONOSCOPY WITH PROPOFOL;  Surgeon: Lucilla Lame, MD;  Location: [place];  Service: Endoscopy;  Laterality: N/A;   POLYPECTOMY  03/10/2015   Procedure: POLYPECTOMY;  Surgeon: Lucilla Lame, MD;  Location: [place];  Service: Endoscopy;;    Prior to Admission medications   Medication Sig Start Date End Date Taking? Authorizing Provider  estradiol (ESTRACE) 2 MG tablet Take 1 tablet (2 mg total) by mouth 2 (two) times daily. 02/21/20  Yes Juline Patch, MD  Multiple Vitamins-Minerals (CENTRUM ADULTS PO) Take 1 tablet by mouth daily.   Yes [provider]  Ascorbic Acid (VITAMIN C PO) Take by mouth. Patient not taking: Reported on 06/19/2020    [provider]  polyethylene glycol-electrolytes (NULYTELY) 420 g solution Drink one 8 oz glass every 20 mins until entire container is finished starting at 5:00pm on 06/18/20 06/11/20   Lucilla Lame, MD    Allergies as of 06/03/2020   (No Known Allergies)    Family History  Problem Relation Age of Onset   Breast cancer Neg Hx     Social History   Socioeconomic History   Marital status: Single    Spouse name: Not on file   Number of children: Not on file   Years of education: Not on file   Highest education level: Not on file  Occupational History   Not on file  Tobacco Use   Smoking status: Never   Smokeless tobacco: Never  Vaping Use    Vaping Use: Never used  Substance and Sexual Activity   Alcohol use: Yes    Alcohol/week: 1.0 standard drink    Types: 1 Glasses of wine per week   Drug use: No   Sexual activity: Not on file  Other Topics Concern   Not on file  Social History Narrative   Not on file   Social Determinants of Health   Financial Resource Strain: Not on file  Food Insecurity: Not on file  Transportation Needs: Not on file  Physical Activity: Not on file  Stress: Not on file  Social Connections: Not on file  Intimate Partner Violence: Not on file    Review of Systems: See HPI, otherwise negative ROS  Physical Exam: BP 118/80   Pulse 63   Temp (!) 97 F (36.1 C) (Temporal)   Ht 5\' 9"  (1.753 m)   Wt 77.1 kg   SpO2 100%   BMI 25.10 kg/m  General:   Alert,  pleasant and cooperative in NAD Head:  Normocephalic and atraumatic. Neck:  Supple; no masses or thyromegaly. Lungs:  Clear throughout to auscultation.    Heart:  Regular rate and rhythm. Abdomen:  Soft, nontender and nondistended. Normal bowel sounds, without guarding, and without rebound.   Neurologic:  Alert and  oriented x4;  grossly normal neurologically.  Impression/Plan: Lucrezia Europe is here for an colonoscopy to be performed for a history of  adenomatous polyps on 2017   Risks, benefits, limitations, and alternatives regarding  colonoscopy have been reviewed with the patient.  Questions have been answered.  All parties agreeable.   Lucilla Lame, MD  06/19/2020, 9:30 AM

## 2020-06-19 NOTE — Anesthesia Postprocedure Evaluation (Signed)
Anesthesia Post Note  Patient: Christy Wright  Procedure(s) Performed: COLONOSCOPY WITH PROPOFOL (Rectum)     Patient location during evaluation: PACU Anesthesia Type: General Level of consciousness: awake and alert and oriented Pain management: satisfactory to patient Vital Signs Assessment: post-procedure vital signs reviewed and stable Respiratory status: spontaneous breathing, nonlabored ventilation and respiratory function stable Cardiovascular status: blood pressure returned to baseline and stable Postop Assessment: Adequate PO intake and No signs of nausea or vomiting Anesthetic complications: no   No notable events documented.  Raliegh Ip

## 2020-06-20 ENCOUNTER — Encounter: Payer: Self-pay | Admitting: Gastroenterology

## 2020-08-08 ENCOUNTER — Ambulatory Visit: Payer: BC Managed Care – PPO | Admitting: Family Medicine

## 2020-10-02 ENCOUNTER — Other Ambulatory Visit: Payer: Self-pay

## 2020-10-02 ENCOUNTER — Ambulatory Visit (INDEPENDENT_AMBULATORY_CARE_PROVIDER_SITE_OTHER): Payer: BC Managed Care – PPO | Admitting: Family Medicine

## 2020-10-02 VITALS — BP 120/70 | HR 76 | Ht 69.0 in | Wt 180.0 lb

## 2020-10-02 DIAGNOSIS — E7801 Familial hypercholesterolemia: Secondary | ICD-10-CM | POA: Diagnosis not present

## 2020-10-02 DIAGNOSIS — Z23 Encounter for immunization: Secondary | ICD-10-CM

## 2020-10-02 DIAGNOSIS — Z008 Encounter for other general examination: Secondary | ICD-10-CM | POA: Diagnosis not present

## 2020-10-02 DIAGNOSIS — E2839 Other primary ovarian failure: Secondary | ICD-10-CM

## 2020-10-02 MED ORDER — ESTRADIOL 2 MG PO TABS: 2.0000 mg | ORAL_TABLET | Freq: Two times a day (BID) | ORAL | 1 refills | Status: DC

## 2020-10-02 NOTE — Progress Notes (Signed)
Date:  10/02/2020   Name:  Christy Wright   DOB:  Mar 22, 1965   MRN:  [id number]   Chief Complaint: Annual Exam (Biometric screening), Flu Vaccine, and pneum 23  Patient is a 55 year old female who presents for a comprehensive physical exam. The patient reports the following problems: biometric screen. Health maintenance has been reviewed up to date. Christy Wright is a 55 y.o. female who presents today for her Complete Annual Exam. She feels well. She reports exercising running. She reports she is sleeping well.       Lab Results  Component Value Date   CREATININE 0.89 09/05/2019   BUN 20 09/05/2019   NA 138 09/05/2019   K 4.3 09/05/2019   CL 102 09/05/2019   CO2 27 09/05/2019   Lab Results  Component Value Date   CHOL 226 (H) 09/05/2019   HDL 103 09/05/2019   LDLCALC 112 (H) 09/05/2019   TRIG 53 09/05/2019   CHOLHDL 2.2 09/05/2019   No results found for: TSH No results found for: HGBA1C No results found for: WBC, HGB, HCT, MCV, PLT Lab Results  Component Value Date   ALT 17 09/05/2019   AST 23 09/05/2019   ALKPHOS 45 09/05/2019   BILITOT 0.8 09/05/2019     Review of Systems  Patient Active Problem List   Diagnosis Date Noted   History of colonic polyps    Special screening for malignant neoplasms, colon    Benign neoplasm of transverse colon     No Known Allergies  Past Surgical History:  Procedure Laterality Date   APPENDECTOMY     AUGMENTATION MAMMAPLASTY Bilateral 1994   BREAST ENHANCEMENT SURGERY     with GRS   COLONOSCOPY WITH PROPOFOL N/A 03/10/2015   Procedure: COLONOSCOPY WITH PROPOFOL;  Surgeon: Lucilla Lame, MD;  Location: [place];  Service: Endoscopy;  Laterality: N/A;   COLONOSCOPY WITH PROPOFOL N/A 06/19/2020   Procedure: COLONOSCOPY WITH PROPOFOL;  Surgeon: Lucilla Lame, MD;  Location: [place];  Service: Endoscopy;  Laterality: N/A;   POLYPECTOMY  03/10/2015   Procedure: POLYPECTOMY;  Surgeon: Lucilla Lame, MD;   Location: [place];  Service: Endoscopy;;    Social History   Tobacco Use   Smoking status: Never   Smokeless tobacco: Never  Vaping Use   Vaping Use: Never used  Substance Use Topics   Alcohol use: Yes    Alcohol/week: 1.0 standard drink    Types: 1 Glasses of wine per week   Drug use: No     Medication list has been reviewed and updated.  No outpatient medications have been marked as taking for the 10/02/20 encounter (Office Visit) with Juline Patch, MD.    [facility] 2/9 Scores 10/02/2020 02/21/2020 09/05/2019 03/21/2019  PHQ - 2 Score 0 0 0 0  PHQ- 9 Score 0 0 0 0    GAD 7 : Generalized Anxiety Score 10/02/2020 02/21/2020 09/05/2019 03/21/2019  Nervous, Anxious, on Edge 0 0 0 0  Control/stop worrying 0 0 0 0  Worry too much - different things 0 0 0 0  Trouble relaxing 0 0 0 0  Restless 0 0 0 0  Easily annoyed or irritable 0 0 0 0  Afraid - awful might happen 0 0 0 0  Total GAD 7 Score 0 0 0 0    BP Readings from Last 3 Encounters:  10/02/20 120/70  06/19/20 111/76  02/21/20 120/80    Physical Exam Vitals and  nursing note reviewed.  Constitutional:      Appearance: She is well-developed.  HENT:     Head: Normocephalic.     Right Ear: Tympanic membrane, ear canal and external ear normal.     Left Ear: Tympanic membrane, ear canal and external ear normal.     Nose: Nose normal.  Eyes:     General: Lids are everted, no foreign bodies appreciated. No scleral icterus.       Left eye: No foreign body or hordeolum.     Conjunctiva/sclera: Conjunctivae normal.     Right eye: Right conjunctiva is not injected.     Left eye: Left conjunctiva is not injected.     Pupils: Pupils are equal, round, and reactive to light.  Neck:     Thyroid: No thyromegaly.     Vascular: No carotid bruit or JVD.     Trachea: No tracheal deviation.  Cardiovascular:     Rate and Rhythm: Normal rate and regular rhythm.     Heart sounds: Normal heart sounds. No murmur heard.   No  friction rub. No gallop.  Pulmonary:     Effort: Pulmonary effort is normal. No respiratory distress.     Breath sounds: Normal breath sounds. No wheezing, rhonchi or rales.  Abdominal:     General: Bowel sounds are normal.     Palpations: Abdomen is soft. There is no mass.     Tenderness: There is no abdominal tenderness. There is no guarding or rebound.  Musculoskeletal:        General: No tenderness. Normal range of motion.     Cervical back: Normal range of motion and neck supple.  Lymphadenopathy:     Cervical: No cervical adenopathy.  Skin:    General: Skin is warm.     Findings: No rash.  Neurological:     Mental Status: She is alert and oriented to person, place, and time.     Cranial Nerves: No cranial nerve deficit.     Deep Tendon Reflexes: Reflexes normal.  Psychiatric:        Mood and Affect: Mood is not anxious or depressed.    Wt Readings from Last 3 Encounters:  10/02/20 180 lb (81.6 kg)  06/19/20 170 lb (77.1 kg)  02/21/20 178 lb (80.7 kg)    BP 120/70   Pulse 76   Ht 5\' 9"  (1.753 m)   Wt 180 lb (81.6 kg)   BMI 26.58 kg/m   Assessment and Plan:  1. Female hypogonadism Chronic.  Controlled.  Stable.  Continue estradiol 2 mg 1 tablet twice a day.  We will recheck in 6 months. - estradiol (ESTRACE) 2 MG tablet; Take 1 tablet (2 mg total) by mouth 2 (two) times daily.  Dispense: 180 tablet; Refill: 1  2. Encounter for biometric screening Patient underwent screening for biometric purposes.  Exam is normal with no abnormalities.  We will obtain per request biometrics lipid panel, CMP, and A1c. - Lipid Panel With LDL/HDL Ratio - Comprehensive Metabolic Panel (CMET) - HgB A1c  3. Familial hypercholesterolemia Chronic.  Controlled.  Stable.  Patient has elevated LDL in the past.  This is controlled with diet and we will continue to do so.  Will check lipid panel for current medical. - Lipid Panel With LDL/HDL Ratio  4. Need for pneumococcal  vaccination Discussed and administered - Pneumococcal polysaccharide vaccine 23-valent greater than or equal to 2yo subcutaneous/IM  5. Need for immunization against influenza Discussed and administered - Flu  Vaccine QUAD 50mo+IM (Fluarix, Fluzone & Alfiuria Quad PF)

## 2020-10-03 LAB — COMPREHENSIVE METABOLIC PANEL
ALT: 14 IU/L (ref 0–32)
AST: 22 IU/L (ref 0–40)
Albumin/Globulin Ratio: 2.2 (ref 1.2–2.2)
Albumin: 4.6 g/dL (ref 3.8–4.9)
Alkaline Phosphatase: 58 IU/L (ref 44–121)
BUN/Creatinine Ratio: 18 (ref 9–23)
BUN: 16 mg/dL (ref 6–24)
Bilirubin Total: 0.5 mg/dL (ref 0.0–1.2)
CO2: 26 mmol/L (ref 20–29)
Calcium: 9.8 mg/dL (ref 8.7–10.2)
Chloride: 101 mmol/L (ref 96–106)
Creatinine, Ser: 0.88 mg/dL (ref 0.57–1.00)
Globulin, Total: 2.1 g/dL (ref 1.5–4.5)
Glucose: 92 mg/dL (ref 65–99)
Potassium: 5.1 mmol/L (ref 3.5–5.2)
Sodium: 140 mmol/L (ref 134–144)
Total Protein: 6.7 g/dL (ref 6.0–8.5)
eGFR: 77 mL/min/{1.73_m2} (ref >59)

## 2020-10-03 LAB — LIPID PANEL WITH LDL/HDL RATIO
Cholesterol, Total: 262 mg/dL — ABNORMAL HIGH (ref 100–199)
HDL: 92 mg/dL (ref >39)
LDL Chol Calc (NIH): 159 mg/dL — ABNORMAL HIGH (ref 0–99)
LDL/HDL Ratio: 1.7 ratio (ref 0.0–3.2)
Triglycerides: 68 mg/dL (ref 0–149)
VLDL Cholesterol Cal: 11 mg/dL (ref 5–40)

## 2020-10-03 LAB — HEMOGLOBIN A1C
Est. average glucose Bld gHb Est-mCnc: 100 mg/dL
Hgb A1c MFr Bld: 5.1 % (ref 4.8–5.6)

## 2021-04-01 ENCOUNTER — Other Ambulatory Visit: Payer: Self-pay

## 2021-04-01 ENCOUNTER — Encounter: Payer: Self-pay | Admitting: Family Medicine

## 2021-04-01 ENCOUNTER — Ambulatory Visit (INDEPENDENT_AMBULATORY_CARE_PROVIDER_SITE_OTHER): Payer: 59 | Admitting: Family Medicine

## 2021-04-01 VITALS — BP 118/80 | HR 76 | Ht 69.0 in | Wt 185.0 lb

## 2021-04-01 DIAGNOSIS — E7801 Familial hypercholesterolemia: Secondary | ICD-10-CM | POA: Diagnosis not present

## 2021-04-01 DIAGNOSIS — E2839 Other primary ovarian failure: Secondary | ICD-10-CM | POA: Diagnosis not present

## 2021-04-01 MED ORDER — ESTRADIOL 2 MG PO TABS: 2.0000 mg | ORAL_TABLET | Freq: Two times a day (BID) | ORAL | 1 refills | Status: DC

## 2021-04-01 NOTE — Progress Notes (Signed)
? ? ?Date:  04/01/2021  ? ?Name:  Christy Wright   DOB:  September 26, 1965   MRN:  [id number] ? ? ?Chief Complaint: hormone replacement therapy and Hyperlipidemia ? ?Patient is a 56 year old female who presents for a HRT exam. The patient reports the following problems: none. Health maintenance has been reviewed up to date. ?  ?  ? ?Hyperlipidemia ?This is a chronic problem. The current episode started more than 1 year ago. The problem is controlled. Recent lipid tests were reviewed and are normal. She has no history of chronic renal disease, diabetes, hypothyroidism, liver disease, obesity or nephrotic syndrome. There are no known factors aggravating her hyperlipidemia. Pertinent negatives include no chest pain, focal sensory loss, focal weakness, leg pain, myalgias or shortness of breath. The current treatment provides moderate improvement of lipids. There are no compliance problems.  There are no known risk factors for coronary artery disease.  ? ?Lab Results  ?Component Value Date  ? NA 140 10/02/2020  ? K 5.1 10/02/2020  ? CO2 26 10/02/2020  ? GLUCOSE 92 10/02/2020  ? BUN 16 10/02/2020  ? CREATININE 0.88 10/02/2020  ? CALCIUM 9.8 10/02/2020  ? EGFR 77 10/02/2020  ? GFRNONAA >60 09/05/2019  ? ?Lab Results  ?Component Value Date  ? CHOL 262 (H) 10/02/2020  ? HDL 92 10/02/2020  ? LDLCALC 159 (H) 10/02/2020  ? TRIG 68 10/02/2020  ? CHOLHDL 2.2 09/05/2019  ? ?No results found for: TSH ?Lab Results  ?Component Value Date  ? HGBA1C 5.1 10/02/2020  ? ?No results found for: WBC, HGB, HCT, MCV, PLT ?Lab Results  ?Component Value Date  ? ALT 14 10/02/2020  ? AST 22 10/02/2020  ? ALKPHOS 58 10/02/2020  ? BILITOT 0.5 10/02/2020  ? ?No results found for: 25OHVITD2, [place], VD25OH  ? ?Review of Systems  ?Constitutional:  Negative for chills and fever.  ?HENT:  Negative for drooling, ear discharge, ear pain and sore throat.   ?Respiratory:  Negative for cough, shortness of breath and wheezing.   ?Cardiovascular:  Negative for chest  pain, palpitations and leg swelling.  ?Gastrointestinal:  Negative for abdominal pain, blood in stool, constipation, diarrhea and nausea.  ?Endocrine: Negative for polydipsia.  ?Genitourinary:  Negative for dysuria, frequency, hematuria and urgency.  ?Musculoskeletal:  Negative for back pain, myalgias and neck pain.  ?Skin:  Negative for rash.  ?Allergic/Immunologic: Negative for environmental allergies.  ?Neurological:  Negative for dizziness, focal weakness and headaches.  ?Hematological:  Does not bruise/bleed easily.  ?Psychiatric/Behavioral:  Negative for suicidal ideas. The patient is not nervous/anxious.   ? ?Patient Active Problem List  ? Diagnosis Date Noted  ? History of colonic polyps   ? Special screening for malignant neoplasms, colon   ? Benign neoplasm of transverse colon   ? ? ?No Known Allergies ? ?Past Surgical History:  ?Procedure Laterality Date  ? APPENDECTOMY    ? AUGMENTATION MAMMAPLASTY Bilateral 1994  ? BREAST ENHANCEMENT SURGERY    ? with GRS  ? COLONOSCOPY WITH PROPOFOL N/A 03/10/2015  ? Procedure: COLONOSCOPY WITH PROPOFOL;  Surgeon: Lucilla Lame, MD;  Location: [place];  Service: Endoscopy;  Laterality: N/A;  ? COLONOSCOPY WITH PROPOFOL N/A 06/19/2020  ? Procedure: COLONOSCOPY WITH PROPOFOL;  Surgeon: Lucilla Lame, MD;  Location: [place];  Service: Endoscopy;  Laterality: N/A;  ? POLYPECTOMY  03/10/2015  ? Procedure: POLYPECTOMY;  Surgeon: Lucilla Lame, MD;  Location: [place];  Service: Endoscopy;;  ? ? ?Social History  ? ?  Tobacco Use  ? Smoking status: Never  ? Smokeless tobacco: Never  ?Vaping Use  ? Vaping Use: Never used  ?Substance Use Topics  ? Alcohol use: Yes  ?  Alcohol/week: 1.0 standard drink  ?  Types: 1 Glasses of wine per week  ? Drug use: No  ? ? ? ?Medication list has been reviewed and updated. ? ?Current Meds  ?Medication Sig  ? Ascorbic Acid (VITAMIN C PO) Take by mouth.  ? estradiol (ESTRACE) 2 MG tablet Take 1 tablet (2 mg total) by mouth  2 (two) times daily.  ? Multiple Vitamins-Minerals (CENTRUM ADULTS PO) Take 1 tablet by mouth daily.  ? ? ? ?  04/01/2021  ?  8:08 AM 10/02/2020  ? 10:25 AM 02/21/2020  ?  9:41 AM 09/05/2019  ?  9:36 AM  ?PHQ 2/9 Scores  ?PHQ - 2 Score 0 0 0 0  ?PHQ- 9 Score 0 0 0 0  ? ? ? ?  04/01/2021  ?  8:08 AM 10/02/2020  ? 10:26 AM 02/21/2020  ?  9:42 AM 09/05/2019  ?  9:36 AM  ?GAD 7 : Generalized Anxiety Score  ?Nervous, Anxious, on Edge 0 0 0 0  ?Control/stop worrying 0 0 0 0  ?Worry too much - different things 0 0 0 0  ?Trouble relaxing 0 0 0 0  ?Restless 0 0 0 0  ?Easily annoyed or irritable 0 0 0 0  ?Afraid - awful might happen 0 0 0 0  ?Total GAD 7 Score 0 0 0 0  ?Anxiety Difficulty Not difficult at all     ? ? ?BP Readings from Last 3 Encounters:  ?04/01/21 118/80  ?10/02/20 120/70  ?06/19/20 111/76  ? ? ?Physical Exam ?Vitals and nursing note reviewed.  ?Constitutional:   ?   Appearance: She is well-developed.  ?HENT:  ?   Head: Normocephalic.  ?   Right Ear: Tympanic membrane, ear canal and external ear normal.  ?   Left Ear: Tympanic membrane, ear canal and external ear normal.  ?   Nose: Nose normal.  ?Eyes:  ?   General: Lids are everted, no foreign bodies appreciated. No scleral icterus.    ?   Left eye: No foreign body or hordeolum.  ?   Conjunctiva/sclera: Conjunctivae normal.  ?   Right eye: Right conjunctiva is not injected.  ?   Left eye: Left conjunctiva is not injected.  ?   Pupils: Pupils are equal, round, and reactive to light.  ?Neck:  ?   Thyroid: No thyromegaly.  ?   Vascular: No JVD.  ?   Trachea: No tracheal deviation.  ?Cardiovascular:  ?   Rate and Rhythm: Normal rate and regular rhythm.  ?   Heart sounds: Normal heart sounds. No murmur heard. ?  No friction rub. No gallop.  ?Pulmonary:  ?   Effort: Pulmonary effort is normal. No respiratory distress.  ?   Breath sounds: Normal breath sounds. No wheezing, rhonchi or rales.  ?Abdominal:  ?   General: Bowel sounds are normal.  ?   Palpations: Abdomen is  soft. There is no mass.  ?   Tenderness: There is no abdominal tenderness. There is no guarding or rebound.  ?Musculoskeletal:     ?   General: No tenderness. Normal range of motion.  ?   Cervical back: Normal range of motion and neck supple.  ?Lymphadenopathy:  ?   Cervical: No cervical adenopathy.  ?Skin: ?   General:  Skin is warm.  ?   Findings: No rash.  ?Neurological:  ?   Mental Status: She is alert and oriented to person, place, and time.  ?   Cranial Nerves: No cranial nerve deficit.  ?   Deep Tendon Reflexes: Reflexes normal.  ?Psychiatric:     ?   Mood and Affect: Mood is not anxious or depressed.  ? ? ?Wt Readings from Last 3 Encounters:  ?04/01/21 185 lb (83.9 kg)  ?10/02/20 180 lb (81.6 kg)  ?06/19/20 170 lb (77.1 kg)  ? ? ?BP 118/80   Pulse 76   Ht _0  (1.753 m)   Wt 185 lb (83.9 kg)   BMI 27.32 kg/m?  ? ?Assessment and Plan: ? ?1. Female hypogonadism ?Chronic.  Controlled.  Stable.  Patient is tolerating and will continue estradiol 2 mg 1 tablet twice a day.  We will recheck and 6 months. ?- estradiol (ESTRACE) 2 MG tablet; Take 1 tablet (2 mg total) by mouth 2 (two) times daily.  Dispense: 180 tablet; Refill: 1 ? ?2. Familial hypercholesterolemia ?Chronic.  Controlled.  Stable.  Patient would prefer to go on a dietary direction and has been on a low-cholesterol low triglyceride diet and has been exercising with daily running.  Weight loss has been maintained.  We will check lipid panel and adjust accordingly. ?- Lipid Panel With LDL/HDL Ratio  ?  ? ? ?

## 2021-04-02 LAB — LIPID PANEL WITH LDL/HDL RATIO
Cholesterol, Total: 217 mg/dL — ABNORMAL HIGH (ref 100–199)
HDL: 84 mg/dL (ref >39)
LDL Chol Calc (NIH): 113 mg/dL — ABNORMAL HIGH (ref 0–99)
LDL/HDL Ratio: 1.3 ratio (ref 0.0–3.2)
Triglycerides: 119 mg/dL (ref 0–149)
VLDL Cholesterol Cal: 20 mg/dL (ref 5–40)

## 2021-10-02 ENCOUNTER — Ambulatory Visit: Payer: 59 | Admitting: Family Medicine

## 2021-10-04 ENCOUNTER — Other Ambulatory Visit: Payer: Self-pay | Admitting: Family Medicine

## 2021-10-04 DIAGNOSIS — E2839 Other primary ovarian failure: Secondary | ICD-10-CM

## 2022-01-17 ENCOUNTER — Other Ambulatory Visit: Payer: Self-pay | Admitting: Family Medicine

## 2022-01-17 DIAGNOSIS — E2839 Other primary ovarian failure: Secondary | ICD-10-CM

## 2022-04-02 ENCOUNTER — Ambulatory Visit: Payer: Self-pay | Admitting: Family Medicine

## 2022-04-19 ENCOUNTER — Other Ambulatory Visit: Payer: Self-pay | Admitting: Family Medicine

## 2022-04-19 DIAGNOSIS — E2839 Other primary ovarian failure: Secondary | ICD-10-CM

## 2023-03-30 IMAGING — MG DIGITAL SCREENING BREAST BILAT IMPLANT W/ TOMO W/ CAD
9 of 15 series · 9 of 31 positions shown · non-contrast
Comparison: Previous exam(s).

CLINICAL DATA: Screening. History of previous bilateral direct
silicone injections.

EXAM:
DIGITAL SCREENING BILATERAL MAMMOGRAM WITH IMPLANTS, CAD AND
TOMOSYNTHESIS
TECHNIQUE: Bilateral screening digital craniocaudal and mediolateral oblique
mammograms were obtained. Bilateral screening digital breast
tomosynthesis was performed. The images were evaluated with
computer-aided detection. Standard and/or implant displaced views
were performed.

[L CC]
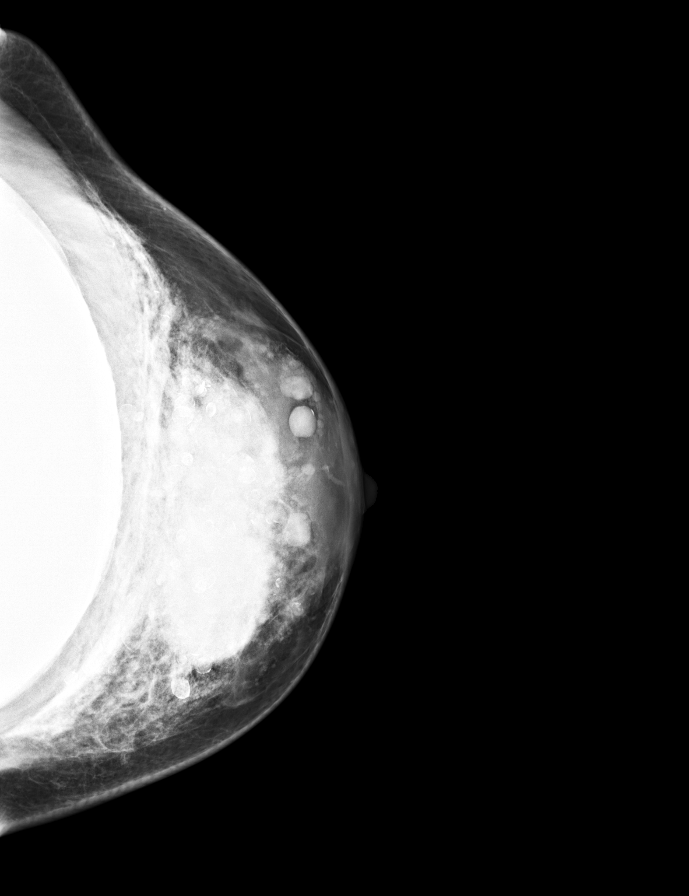

[L MLO (1 of 2)]
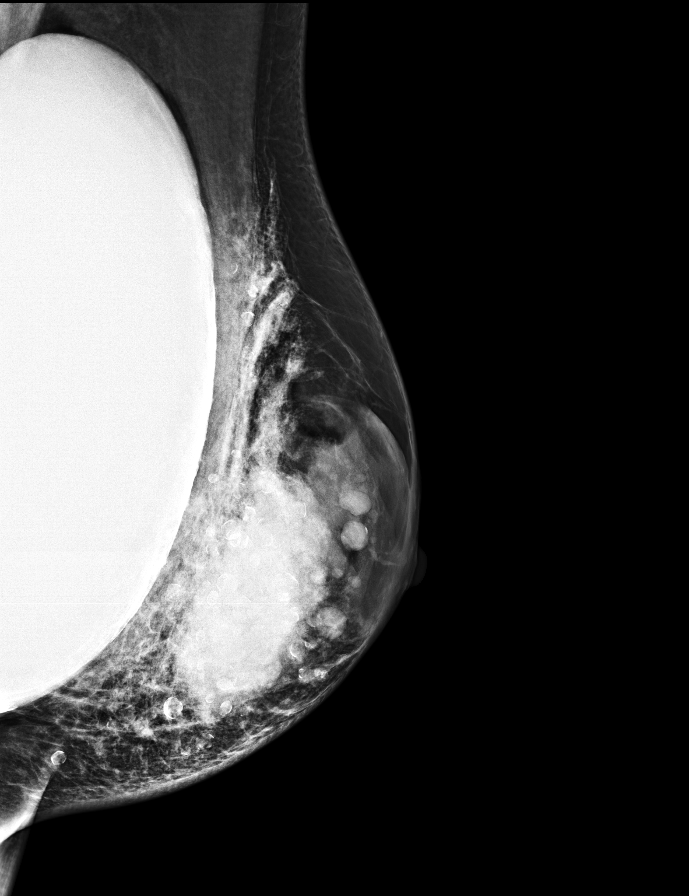

[R CC]
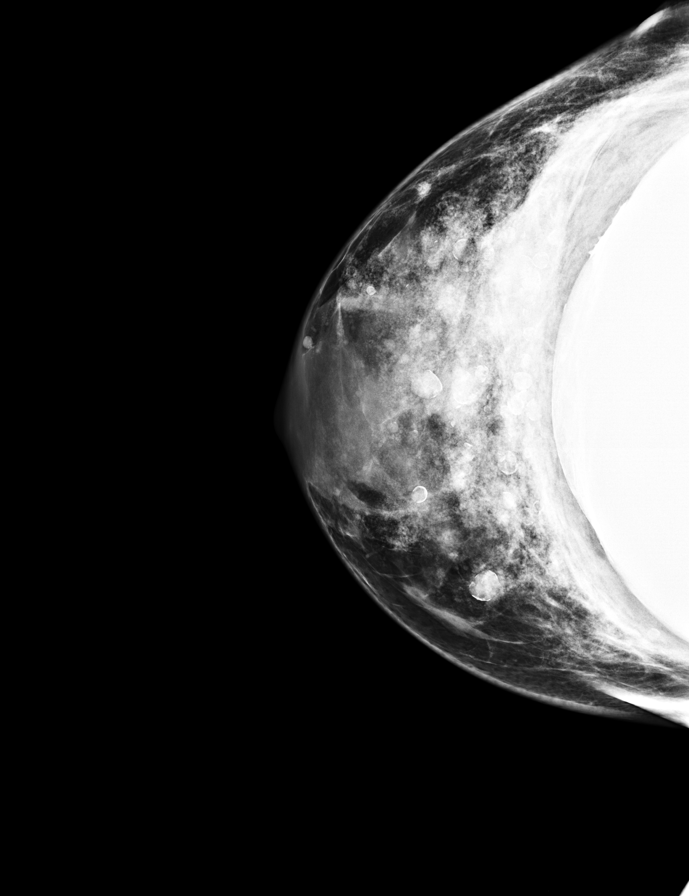

[R MLO (1 of 2)]
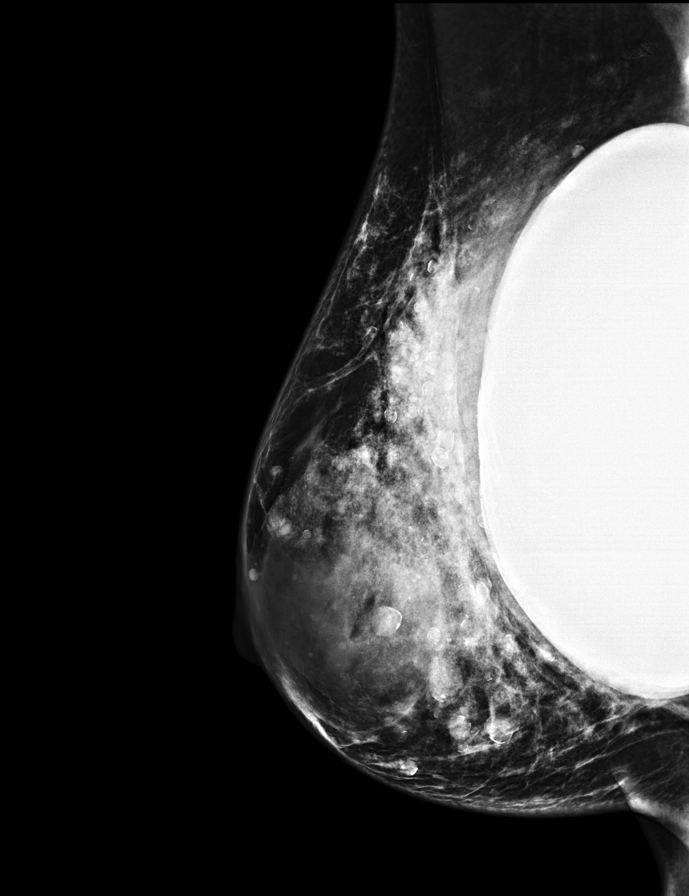

[R MLO (2 of 2)]
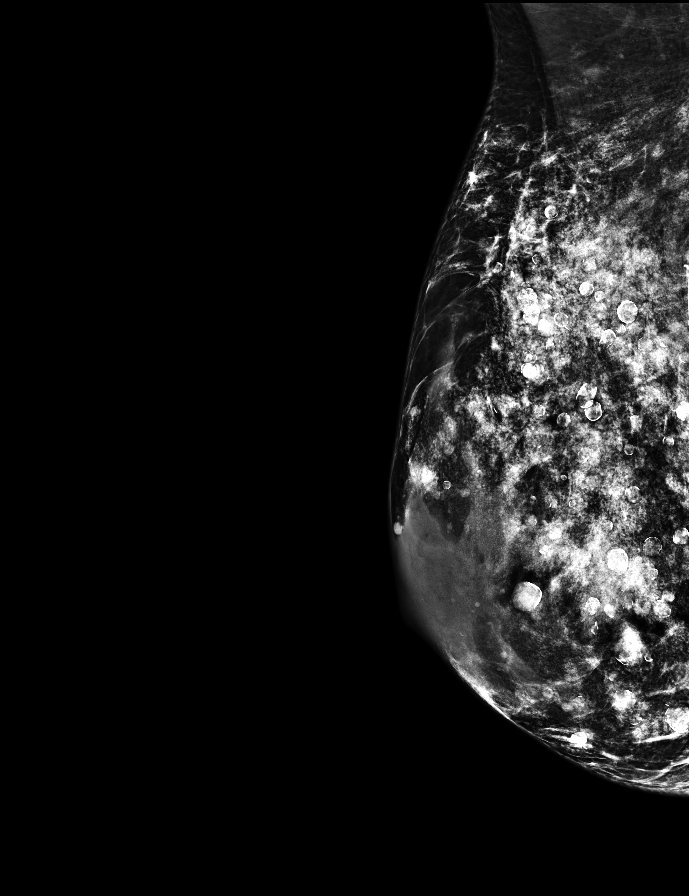

[L MLO (2 of 2)]
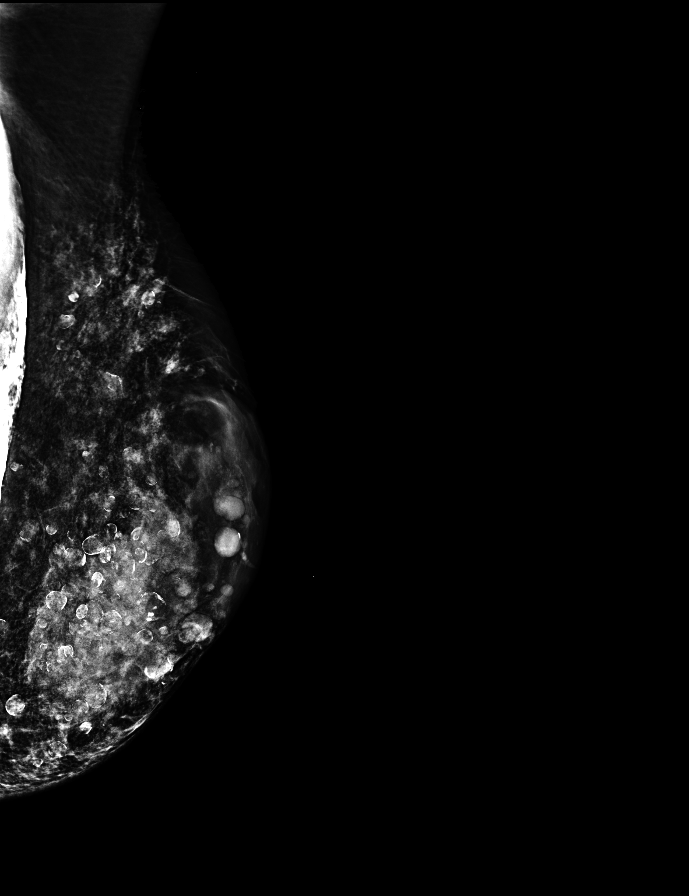

[L CC synth-2D]
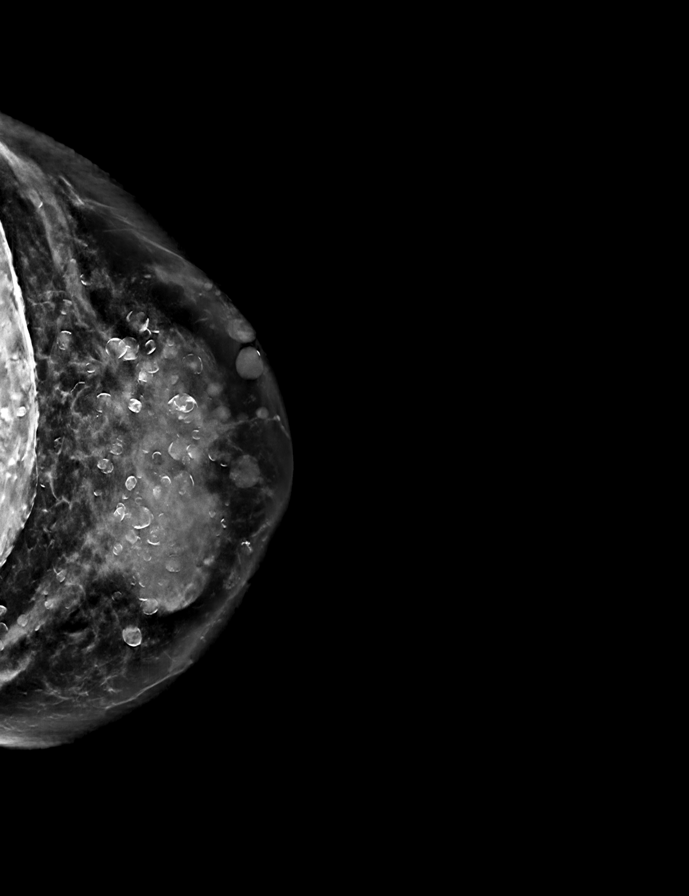

[R MLO synth-2D]
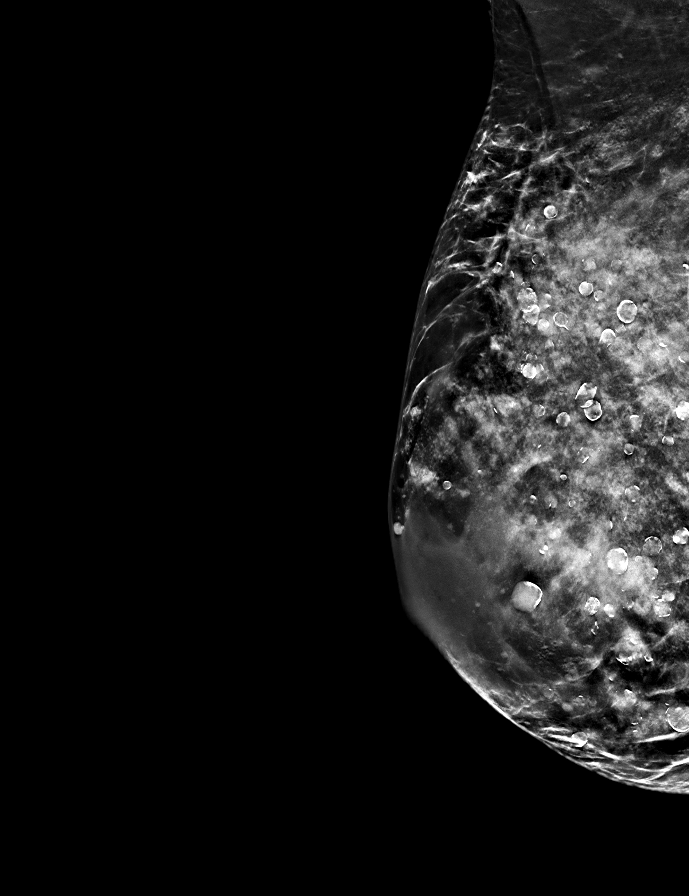

[R CC synth-2D]
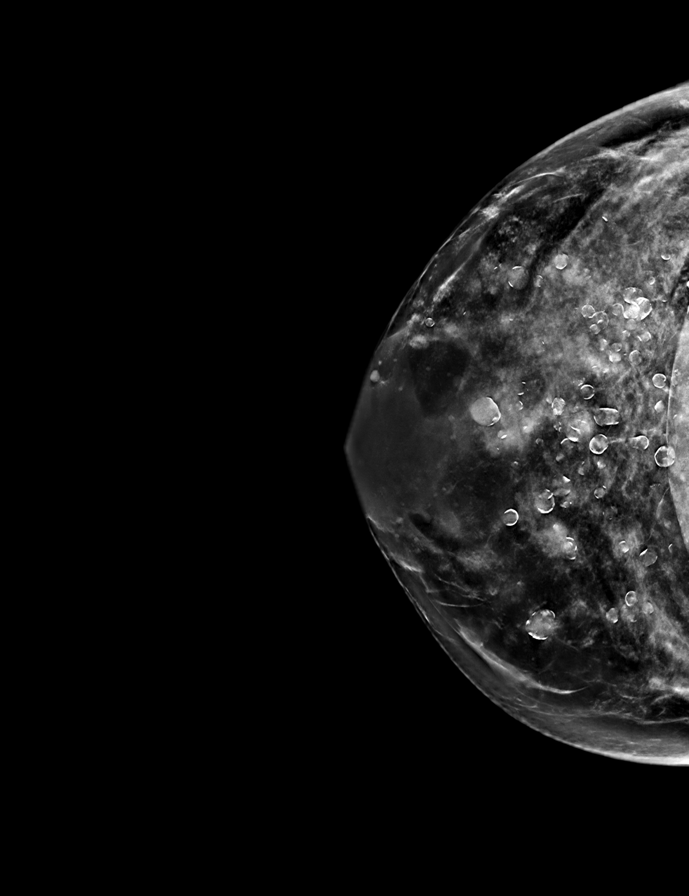

[9 of 31 positions shown; findings below may reference images not displayed]

ACR Breast Density Category d: The breast tissue is extremely dense,
which lowers the sensitivity of mammography.
FINDINGS: The patient has retropectoral implants. There are no findings
suspicious for malignancy.
IMPRESSION: No mammographic evidence of malignancy. A result letter of this
screening mammogram will be mailed directly to the patient.

RECOMMENDATION:
Screening mammogram in one year. (Code:AY-X-H3J)

BI-RADS CATEGORY  1:  Negative.
